# Patient Record
Sex: Male | Born: 1960 | Race: White | Hispanic: No | Marital: Single | Smoking: Former smoker
Health system: Southern US, Community
[De-identification: ages and names within clinical notes are randomized; demographics above are authoritative.]

## PROBLEM LIST (undated history)

## (undated) DIAGNOSIS — I82409 Acute embolism and thrombosis of unspecified deep veins of unspecified lower extremity: Secondary | ICD-10-CM

## (undated) DIAGNOSIS — E119 Type 2 diabetes mellitus without complications: Secondary | ICD-10-CM

## (undated) DIAGNOSIS — Z973 Presence of spectacles and contact lenses: Secondary | ICD-10-CM

## (undated) DIAGNOSIS — M940 Chondrocostal junction syndrome [Tietze]: Secondary | ICD-10-CM

## (undated) HISTORY — PX: TONSILECTOMY, ADENOIDECTOMY, BILATERAL MYRINGOTOMY AND TUBES: SHX2538

## (undated) HISTORY — PX: KNEE SURGERY: SHX244

## (undated) HISTORY — PX: COLONOSCOPY: SHX174

## (undated) HISTORY — PX: NASAL SEPTUM SURGERY: SHX37

## (undated) HISTORY — PX: BACK SURGERY: SHX140

## (undated) HISTORY — PX: INNER EAR SURGERY: SHX679

## (undated) HISTORY — PX: FOOT SURGERY: SHX648

---

## 2011-05-14 ENCOUNTER — Emergency Department: Payer: Self-pay | Admitting: *Deleted

## 2011-09-25 ENCOUNTER — Emergency Department (HOSPITAL_COMMUNITY)
Admission: EM | Admit: 2011-09-25 | Discharge: 2011-09-25 | Disposition: A | Discharge status: Home Health | Payer: Self-pay | Attending: Emergency Medicine | Admitting: Emergency Medicine

## 2011-09-25 ENCOUNTER — Emergency Department (HOSPITAL_COMMUNITY): Payer: Self-pay

## 2011-09-25 DIAGNOSIS — R209 Unspecified disturbances of skin sensation: Secondary | ICD-10-CM | POA: Insufficient documentation

## 2011-09-25 DIAGNOSIS — W312XXA Contact with powered woodworking and forming machines, initial encounter: Secondary | ICD-10-CM | POA: Insufficient documentation

## 2011-09-25 DIAGNOSIS — S61209A Unspecified open wound of unspecified finger without damage to nail, initial encounter: Secondary | ICD-10-CM | POA: Insufficient documentation

## 2011-09-25 DIAGNOSIS — S61219A Laceration without foreign body of unspecified finger without damage to nail, initial encounter: Secondary | ICD-10-CM

## 2011-09-25 MED ORDER — TETANUS-DIPHTHERIA TOXOIDS TD 5-2 LFU IM INJ
0.5000 mL | INJECTION | Freq: Once | INTRAMUSCULAR | Status: DC
  Filled 2011-09-25: qty 0.5

## 2011-09-25 MED ORDER — CEPHALEXIN 500 MG PO CAPS: 500.0000 mg | ORAL_CAPSULE | Freq: Two times a day (BID) | ORAL | Status: AC

## 2011-09-25 MED ORDER — CEPHALEXIN 250 MG PO CAPS
500.0000 mg | ORAL_CAPSULE | Freq: Once | ORAL | Status: AC
  Administered 2011-09-25: 500 mg via ORAL
  Filled 2011-09-25: qty 2

## 2011-09-25 MED ORDER — LIDOCAINE HCL (PF) 1 % IJ SOLN: 5.0000 mL | Freq: Once | INTRAMUSCULAR | Status: DC

## 2011-09-25 MED ORDER — TETANUS-DIPHTH-ACELL PERTUSSIS 5-2.5-18.5 LF-MCG/0.5 IM SUSP
0.5000 mL | Freq: Once | INTRAMUSCULAR | Status: AC
  Administered 2011-09-25: 0.5 mL via INTRAMUSCULAR

## 2011-09-25 NOTE — ED Notes (Signed)
Pt lacs are dressed. Pt states feeling starting to improve.

## 2011-09-25 NOTE — ED Provider Notes (Signed)
History     CSN: [id number]  Arrival date & time 09/25/11  0055   First MD Initiated Contact with Patient 09/25/11 [phone]      No chief complaint on file.   (Consider location/radiation/quality/duration/timing/severity/associated sxs/prior treatment) HPI Patient presents immediately after suffering laceration to his second and third digit on the left hand.  He is right-handed.  He had his left hand caught in the blade of a table saw.  Since the event he has had persistent, sharp pain in the effected fingers.  He notes mild tingling distally.  No other injuries, no other focal complaints. No past medical history on file.  No past surgical history on file.  No family history on file.  History  Substance Use Topics  . Smoking status: Not on file  . Smokeless tobacco: Not on file  . Alcohol Use: Not on file      Review of Systems  All other systems reviewed and are negative.    Allergies  Review of patient's allergies indicates no known allergies.  Home Medications  No current outpatient prescriptions on file.  There were no vitals taken for this visit.  Physical Exam  Nursing note and vitals reviewed. Constitutional: He appears well-developed and well-nourished. No distress.  HENT:  Head: Normocephalic and atraumatic.  Eyes: Conjunctivae and EOM are normal.  Cardiovascular: Normal rate, regular rhythm and intact distal pulses.   Pulmonary/Chest: Effort normal.  Musculoskeletal:       Arms: Skin: Skin is warm and dry.    ED Course  LACERATION REPAIR Date/Time: 09/25/2011 4:11 AM Performed by: Gerhard Munch Authorized by: Gerhard Munch Consent: Verbal consent obtained. The procedure was performed in an emergent situation. Risks and benefits: risks, benefits and alternatives were discussed Consent given by: patient Patient identity confirmed: verbally with patient Time out: Immediately prior to procedure a "time out" was called to verify the correct  patient, procedure, equipment, support staff and site/side marked as required. Body area: upper extremity (Left middle and pointer finger) Laceration length: 7 cm Contamination: The wound is contaminated. Tendon involvement: none Nerve involvement: superficial Vascular damage: no Anesthesia: digital block Local anesthetic: lidocaine 2% without epinephrine Anesthetic total: 8 ml Patient sedated: no Preparation: Patient was prepped and draped in the usual sterile fashion. Irrigation solution: saline Irrigation method: syringe Amount of cleaning: standard Debridement: none Degree of undermining: none Skin closure: 4-0 nylon Technique: simple Approximation: loose Approximation difficulty: simple Dressing: antibiotic ointment and gauze roll Patient tolerance: Patient tolerated the procedure well with no immediate complications.   (including critical care time)  Labs Reviewed - No data to display Dg Hand Complete Left  09/25/2011  *RADIOLOGY REPORT*  Clinical Data: Open fracture.  LEFT HAND - COMPLETE 3+ VIEW  Comparison: None.  Findings: Tiny 1 mm bony density is seen adjacent to the head of the middle phalanx of the long finger.  A avulsion fracture of indeterminate age is not excluded.  Bony framework is otherwise intact.  Soft tissue injury to the tuft of the long finger is noted.  IMPRESSION: Tiny avulsion fracture in the long finger as described of indeterminate age.  Original Report Authenticated By: Donavan Burnet, M.D.   XR reviewed by me  No diagnosis found.    MDM  This generally well they'll presents after suffering a laceration to 2 digits on his left hand in a table saw accident.  On exam the patient is in no distress.  There is sensory loss distal to the lacerations on  the ipsilateral side.  Refill is appropriate.  The patient has preserved flexion and extension in both digits.  There is evidence of a minor fracture, though it is unclear if this occurred during the  event.  The patient's wound repair, as above.  His tetanus status was updated.  Discharged with antibiotics to followup here for suture removal.     Gerhard Munch, MD 09/25/11 [phone]

## 2011-09-25 NOTE — ED Notes (Signed)
Pt to ED c/o lac to middle and ring finger of L hand after cutting it with a table saw.  Lacs into nail bed.  No active bleeding .  Pt does not want pain meds at this time.  Thru and thru lac but no avulsion.

## 2013-06-14 ENCOUNTER — Emergency Department (HOSPITAL_COMMUNITY)
Admission: EM | Admit: 2013-06-14 | Discharge: 2013-06-14 | Disposition: A | Discharge status: Home / Self Care | Payer: BC Managed Care – PPO | Source: Home / Self Care | Attending: Family Medicine | Admitting: Family Medicine

## 2013-06-14 ENCOUNTER — Encounter (HOSPITAL_COMMUNITY): Payer: Self-pay | Admitting: Emergency Medicine

## 2013-06-14 DIAGNOSIS — R071 Chest pain on breathing: Secondary | ICD-10-CM

## 2013-06-14 DIAGNOSIS — R0789 Other chest pain: Secondary | ICD-10-CM

## 2013-06-14 HISTORY — DX: Chondrocostal junction syndrome (tietze): M94.0

## 2013-06-14 MED ORDER — DICLOFENAC POTASSIUM 50 MG PO TABS: 50.0000 mg | ORAL_TABLET | Freq: Three times a day (TID) | ORAL | Status: DC

## 2013-06-14 NOTE — ED Provider Notes (Signed)
CSN: [id number]     Arrival date & time 06/14/13  1700 History   First MD Initiated Contact with Patient 06/14/13 1833     Chief Complaint  Patient presents with  . Chest Pain  . Abdominal Pain   (Consider location/radiation/quality/duration/timing/severity/associated sxs/prior Treatment) Patient is a 52 y.o. male presenting with chest pain and abdominal pain. The history is provided by the patient.  Chest Pain Pain location:  R lateral chest, L lateral chest and epigastric Pain quality: burning and sharp   Pain radiates to:  Does not radiate Pain radiates to the back: no   Pain severity:  Moderate Onset quality:  Gradual Duration:  4 days Progression:  Unchanged Chronicity:  Recurrent (h/o costochondrosis.) Context: movement and raising an arm   Associated symptoms: abdominal pain and cough   Associated symptoms: no fever, no heartburn, no palpitations and no shortness of breath   Risk factors: smoking   Abdominal Pain Associated symptoms include chest pain and abdominal pain. Pertinent negatives include no shortness of breath.    Past Medical History  Diagnosis Date  . Costochondritis    History reviewed. No pertinent past surgical history. No family history on file. History  Substance Use Topics  . Smoking status: Current Every Day Smoker  . Smokeless tobacco: Not on file  . Alcohol Use: Yes    Review of Systems  Constitutional: Negative.  Negative for fever.  Respiratory: Positive for cough. Negative for chest tightness and shortness of breath.   Cardiovascular: Positive for chest pain. Negative for palpitations.  Gastrointestinal: Positive for abdominal pain. Negative for heartburn.    Allergies  Review of patient's allergies indicates no known allergies.  Home Medications   Current Outpatient Rx  Name  Route  Sig  Dispense  Refill  . diclofenac (CATAFLAM) 50 MG tablet   Oral   Take 1 tablet (50 mg total) by mouth 3 (three) times daily. For chest soreness  as needed.   30 tablet   0    BP 135/72  Pulse 104  Temp(Src) 98.9 F (37.2 C) (Oral)  Resp 19  SpO2 96% Physical Exam  Nursing note and vitals reviewed. Constitutional: He is oriented to person, place, and time. He appears well-developed and well-nourished.  Neck: No thyromegaly present.  Cardiovascular: Normal rate, regular rhythm, normal heart sounds and intact distal pulses.   Pulmonary/Chest: Effort normal and breath sounds normal. He exhibits tenderness.    Abdominal: Soft. Bowel sounds are normal.  Lymphadenopathy:    He has no cervical adenopathy.  Neurological: He is alert and oriented to person, place, and time.  Skin: Skin is warm and dry.    ED Course  Procedures (including critical care time) Labs Review Labs Reviewed - No data to display Imaging Review No results found.  EKG Interpretation    Date/Time:    Ventricular Rate:    PR Interval:    QRS Duration:   QT Interval:    QTC Calculation:   R Axis:     Text Interpretation:              MDM  [id number]--wnl.    Linna Hoff, MD 06/14/13 [phone]

## 2013-06-14 NOTE — ED Notes (Signed)
Patient reports a burning sensation across chest and down both sides of abdomen, onset 11/27.  Patient denies nausea or vomiting.  Patient reports pain escalates with cough, sneeze, belching, hiccups.  Denies recent cold symptoms

## 2013-09-13 ENCOUNTER — Encounter (HOSPITAL_COMMUNITY): Payer: Self-pay | Admitting: Emergency Medicine

## 2013-09-13 ENCOUNTER — Emergency Department (HOSPITAL_COMMUNITY)
Admission: EM | Admit: 2013-09-13 | Discharge: 2013-09-13 | Disposition: A | Discharge status: Nursing Home (Includes ICF) | Payer: BC Managed Care – PPO | Source: Home / Self Care | Attending: Family Medicine | Admitting: Family Medicine

## 2013-09-13 ENCOUNTER — Emergency Department (HOSPITAL_COMMUNITY)
Admission: EM | Admit: 2013-09-13 | Discharge: 2013-09-13 | Disposition: A | Discharge status: Home / Self Care | Payer: BC Managed Care – PPO | Attending: Emergency Medicine | Admitting: Emergency Medicine

## 2013-09-13 DIAGNOSIS — M79606 Pain in leg, unspecified: Secondary | ICD-10-CM

## 2013-09-13 DIAGNOSIS — F17200 Nicotine dependence, unspecified, uncomplicated: Secondary | ICD-10-CM | POA: Insufficient documentation

## 2013-09-13 DIAGNOSIS — M7989 Other specified soft tissue disorders: Secondary | ICD-10-CM

## 2013-09-13 DIAGNOSIS — Z8739 Personal history of other diseases of the musculoskeletal system and connective tissue: Secondary | ICD-10-CM | POA: Insufficient documentation

## 2013-09-13 DIAGNOSIS — Z791 Long term (current) use of non-steroidal anti-inflammatories (NSAID): Secondary | ICD-10-CM | POA: Insufficient documentation

## 2013-09-13 DIAGNOSIS — I82402 Acute embolism and thrombosis of unspecified deep veins of left lower extremity: Secondary | ICD-10-CM

## 2013-09-13 DIAGNOSIS — M79609 Pain in unspecified limb: Secondary | ICD-10-CM

## 2013-09-13 DIAGNOSIS — I82409 Acute embolism and thrombosis of unspecified deep veins of unspecified lower extremity: Secondary | ICD-10-CM | POA: Insufficient documentation

## 2013-09-13 LAB — I-STAT CHEM 8, ED
BUN: 7 mg/dL (ref 6–23)
CALCIUM ION: 1.19 mmol/L (ref 1.12–1.23)
CHLORIDE: 99 meq/L (ref 96–112)
CREATININE: 0.8 mg/dL (ref 0.50–1.35)
GLUCOSE: 109 mg/dL — AB (ref 70–99)
HEMATOCRIT: 58 % — AB (ref 39.0–52.0)
HEMOGLOBIN: 19.7 g/dL — AB (ref 13.0–17.0)
Potassium: 4 mEq/L (ref 3.7–5.3)
Sodium: 142 mEq/L (ref 137–147)
TCO2: 28 mmol/L (ref 0–100)

## 2013-09-13 MED ORDER — RIVAROXABAN (XARELTO) VTE STARTER PACK (15 & 20 MG): ORAL_TABLET | ORAL | Status: DC

## 2013-09-13 NOTE — ED Notes (Signed)
Pt sent from urgent care with left calf redness, warmth, and swelling. Sent to R/O blood clot, urgent care already entered order.

## 2013-09-13 NOTE — ED Provider Notes (Signed)
CSN: [id number]     Arrival date & time 09/13/13  1704 History   First MD Initiated Contact with Patient 09/13/13 2146     Chief Complaint  Patient presents with  . Leg Pain     (Consider location/radiation/quality/duration/timing/severity/associated sxs/prior Treatment) [name] presenting with leg pain.  Leg Pain Location:  Leg Injury: no   Leg location:  L leg Pain details:    Quality:  Aching   Radiates to:  Does not radiate   Severity:  Mild   Onset quality:  Gradual   Duration:  3 days   Timing:  Constant   Progression:  Worsening Chronicity:  New Relieved by:  Nothing Exacerbated by: Palpation. Associated symptoms: swelling   Associated symptoms comment:  No chest pain or shortness of breath.   Past Medical History  Diagnosis Date  . Costochondritis    Past Surgical History  Procedure Laterality Date  . Joint replacement      knee surgery  . Back surgery     No family history on file. History  Substance Use Topics  . Smoking status: Current Every Day Smoker  . Smokeless tobacco: Not on file  . Alcohol Use: Yes    Review of Systems  All other systems reviewed and are negative.      Allergies  Thimerosal  Home Medications   Current Outpatient Rx  Name  Route  Sig  Dispense  Refill  . diclofenac (CATAFLAM) 50 MG tablet   Oral   Take 50 mg by mouth 3 (three) times daily.          BP 132/79  Pulse 92  Temp(Src) 99.2 F (37.3 C) (Oral)  Resp 18  Ht 6' (1.829 m)  Wt 225 lb (102.059 kg)  BMI 30.51 kg/m2  SpO2 96% Physical Exam  Nursing note and vitals reviewed. Constitutional: He is oriented to person, place, and time. He appears well-developed and well-nourished. No distress.  HENT:  Head: Normocephalic and atraumatic.  Mouth/Throat: Oropharynx is clear and moist.  Eyes: Conjunctivae are normal. Pupils are equal, round, and reactive to light. No scleral icterus.  Neck: Neck supple.  Cardiovascular: Normal rate,  regular rhythm, normal heart sounds and intact distal pulses.   No murmur heard. Pulmonary/Chest: Effort normal and breath sounds normal. No stridor. No respiratory distress. He has no wheezes. He has no rales.  Abdominal: Soft. He exhibits no distension. There is no tenderness.  Musculoskeletal: Normal range of motion. He exhibits edema (left lower extremity).  Neurological: He is alert and oriented to person, place, and time.  Skin: Skin is warm and dry. No rash noted.  Psychiatric: He has a normal mood and affect. His behavior is normal.    ED Course  Procedures (including critical care time) Labs Review Labs Reviewed  I-STAT CHEM 8, ED - Abnormal; Notable for the following:    Glucose, Bld 109 (*)    Hemoglobin 19.7 (*)    HCT 58.0 (*)    All other components within normal limits   Imaging Review No results found.   EKG Interpretation None      MDM   Final diagnoses:  Left leg DVT    53 year old male sent from urgent care for evaluation of a swollen left leg. Found to have an acute DVT on ultrasound. He appears to be low risk for bleeding and will need to be started on anticoagulation.  No signs or symptoms of PE. Will initiate Xarelto indicates resources  to establish PCP care.    Candyce ChurnJohn David Sophonie Goforth III, MD 09/13/13 [phone]

## 2013-09-13 NOTE — ED Provider Notes (Signed)
CSN: [id number]     Arrival date & time 09/13/13  1427 History   None    Chief Complaint  Patient presents with  . Leg Pain   (Consider location/radiation/quality/duration/timing/severity/associated sxs/prior Treatment) HPI Comments: 53 year old male presents for evaluation of left leg pain and swelling. This is been present for 3 days, getting progressively worse. He denies any injury and does not know what he could come causes. He has a recent increased level of physical activity. He does admit to recent prolonged immobility with a viral URI, being in bed for about 5 days. He denies any chest pain or shortness of breath at this time. He says the left leg is swollen and tender in the calf. He has been elevating it and applying heat.  No history of DVT or PE. No recent travel  [name] presenting with leg pain.  Leg Pain Associated symptoms: no fatigue, no fever and no neck pain     Past Medical History  Diagnosis Date  . Costochondritis    History reviewed. No pertinent past surgical history. History reviewed. No pertinent family history. History  Substance Use Topics  . Smoking status: Current Every Day Smoker  . Smokeless tobacco: Not on file  . Alcohol Use: Yes    Review of Systems  Constitutional: Negative for fever, chills and fatigue.  HENT: Negative for sore throat.   Eyes: Negative for visual disturbance.  Respiratory: Negative for cough and shortness of breath.   Cardiovascular: Positive for leg swelling (and pain in the left). Negative for chest pain and palpitations.  Gastrointestinal: Negative for nausea, vomiting, abdominal pain, diarrhea and constipation.  Genitourinary: Negative for dysuria, urgency, frequency and hematuria.  Musculoskeletal: Negative for arthralgias, myalgias, neck pain and neck stiffness.  Skin: Negative for rash.  Neurological: Negative for dizziness, weakness and light-headedness.    Allergies  Review of patient's  allergies indicates no known allergies.  Home Medications   Current Outpatient Rx  Name  Route  Sig  Dispense  Refill  . diclofenac (CATAFLAM) 50 MG tablet   Oral   Take 1 tablet (50 mg total) by mouth 3 (three) times daily. For chest soreness as needed.   30 tablet   0    BP 152/91  Pulse 90  Temp(Src) 99.4 F (37.4 C) (Oral)  Resp 18  SpO2 95% Physical Exam  Nursing note and vitals reviewed. Constitutional: He is oriented to person, place, and time. He appears well-developed and well-nourished. No distress.  HENT:  Head: Normocephalic.  Cardiovascular: Normal rate, regular rhythm and normal heart sounds.   Pulmonary/Chest: Effort normal and breath sounds normal. No respiratory distress.  Musculoskeletal:  Left calf: Swelling, with greater the 1 cm difference between the left and right. Positive Homans sign. Tender, palpable cord in the calf. No edema. Compared to the right, the left lower extremity is very warm. The pain does not go up into the thigh  Neurological: He is alert and oriented to person, place, and time. Coordination normal.  Skin: Skin is warm and dry. No rash noted. He is not diaphoretic.  Psychiatric: He has a normal mood and affect. Judgment normal.    ED Course  Procedures (including critical care time) Labs Review Labs Reviewed - No data to display Imaging Review No results found.   MDM   1. Leg pain   2. Leg swelling    Patient with a warm, swollen, left lower extremity. Positive Homans sign. Low-grade fever. Probable DVT,  patient has been sent over to get a venous duplex and will follow up in the ER for initiation of treatment and coordination of care      Scott GoodZachary H Bijal Siglin, PA-C 09/13/13 1638

## 2013-09-13 NOTE — ED Notes (Signed)
Pulse palpable to LLE

## 2013-09-13 NOTE — ED Notes (Signed)
Pt  Reports  Pain  And  Swelling   Of  The  Left  Calf  X     3  Days        denys  Any      Injury           denys  Any  Risk  Factors           Pt  denys  Any  Chest  Pain  Or    Shortness  Of  Breath      Skin is  Warm  /  Dry

## 2013-09-13 NOTE — ED Notes (Signed)
Spoke  To  Lorrine Kinindy  Crawford  In  X ray  And  Notified   Her  Of  Pt  Being transferred  Toe er    And  gneed  gor  Doppler  study

## 2013-09-13 NOTE — Progress Notes (Signed)
*  PRELIMINARY RESULTS* Vascular Ultrasound Left lower extremity venous duplex has been completed.  Preliminary findings: Evidence of DVT involving the Left distal Femoral vein, popliteal vein, posterior tibial veins, and peroneal veins. No DVT of the Right CFV.   Farrel DemarkJill Eunice, RDMS, RVT  09/13/2013, 5:59 PM

## 2013-09-13 NOTE — Discharge Instructions (Signed)
Deep Vein Thrombosis A deep vein thrombosis (DVT) is a blood clot that develops in the deep, larger veins of the leg, arm, or pelvis. These are more dangerous than clots that might form in veins near the surface of the body. A DVT can lead to complications if the clot breaks off and travels in the bloodstream to the lungs.  A DVT can damage the valves in your leg veins, so that instead of flowing upward, the blood pools in the lower leg. This is called post-thrombotic syndrome, and it can result in pain, swelling, discoloration, and sores on the leg. CAUSES Usually, several things contribute to blood clots forming. Contributing factors include:  The flow of blood slows down.  The inside of the vein is damaged in some way.  You have a condition that makes blood clot more easily. RISK FACTORS Some people are more likely than others to develop blood clots. Risk factors include:   Older age, especially over 53 years of age.  Having a family history of blood clots or if you have already had a blot clot.  Having major or lengthy surgery. This is especially true for surgery on the hip, knee, or belly (abdomen). Hip surgery is particularly high risk.  Breaking a hip or leg.  Sitting or lying still for a long time. This includes long-distance travel, paralysis, or recovery from an illness or surgery.  Having cancer or cancer treatment.  Having a long, thin tube (catheter) placed inside a vein during a medical procedure.  Being overweight (obese).  Pregnancy and childbirth.  Hormone changes make the blood clot more easily during pregnancy.  The fetus puts pressure on the veins of the pelvis.  There is a risk of injury to veins during delivery or a caesarean. The risk is highest just after childbirth.  Medicines with the male hormone estrogen. This includes birth control pills and hormone replacement therapy.  Smoking.  Other circulation or heart problems.  SIGNS AND SYMPTOMS When  a clot forms, it can either partially or totally block the blood flow in that vein. Symptoms of a DVT can include:  Swelling of the leg or arm, especially if one side is much worse.  Warmth and redness of the leg or arm, especially if one side is much worse.  Pain in an arm or leg. If the clot is in the leg, symptoms may be more noticeable or worse when standing or walking. The symptoms of a DVT that has traveled to the lungs (pulmonary embolism, PE) usually start suddenly and include:  Shortness of breath.  Coughing.  Coughing up blood or blood-tinged phlegm.  Chest pain. The chest pain is often worse with deep breaths.  Rapid heartbeat. Anyone with these symptoms should get emergency medical treatment right away. Call your local emergency services (911 in the U.S.) if you have these symptoms. DIAGNOSIS If a DVT is suspected, your health care provider will take a full medical history and perform a physical exam. Tests that also may be required include:  Blood tests, including studies of the clotting properties of the blood.  Ultrasonography to see if you have clots in your legs or lungs.  X-rays to show the flow of blood when dye is injected into the veins (venography).  Studies of your lungs if you have any chest symptoms. PREVENTION  Exercise the legs regularly. Take a brisk 30-minute walk every day.  Maintain a weight that is appropriate for your height.  Avoid sitting or lying in bed  for long periods of time without moving your legs.  Women, particularly those over the age of 24 years, should consider the risks and benefits of taking estrogen medicines, including birth control pills.  Do not smoke, especially if you take estrogen medicines.  Long-distance travel can increase your risk of DVT. You should exercise your legs by walking or pumping the muscles every hour.  In-hospital prevention:  Many of the risk factors above relate to situations that exist with  hospitalization, either for illness, injury, or elective surgery.  Your health care provider will assess you for the need for venous thromboembolism prophylaxis when you are admitted to the hospital. If you are having surgery, your surgeon will assess you the day of or day after surgery.  Prevention may include medical and nonmedical measures. TREATMENT Once identified, a DVT can be treated. It can also be prevented in some circumstances. Once you have had a DVT, you may be at increased risk for a DVT in the future. The most common treatment for DVT is blood thinning (anticoagulant) medicine, which reduces the blood's tendency to clot. Anticoagulants can stop new blood clots from forming and stop old ones from growing. They cannot dissolve existing clots. Your body does this by itself over time. Anticoagulants can be given by mouth, by IV access, or by injection. Your health care provider will determine the best program for you. Other medicines or treatments that may be used are:  Heparin or related medicines (low molecular weight heparin) are usually the first treatment for a blood clot. They act quickly. However, they cannot be taken orally.  Heparin can cause a fall in a component of blood that stops bleeding and forms blood clots (platelets). You will be monitored with blood tests to be sure this does not occur.  Warfarin is an anticoagulant that can be swallowed. It takes a few days to start working, so usually heparin or related medicines are used in combination. Once warfarin is working, heparin is usually stopped.  Less commonly, clot dissolving drugs (thrombolytics) are used to dissolve a DVT. They carry a high risk of bleeding, so they are used mainly in severe cases, where your life or a limb is threatened.  Very rarely, a blood clot in the leg needs to be removed surgically.  If you are unable to take anticoagulants, your health care provider may arrange for you to have a filter placed  in a main vein in your abdomen. This filter prevents clots from traveling to your lungs. HOME CARE INSTRUCTIONS  Take all medicines prescribed by your health care provider. Only take over-the-counter or prescription medicines for pain, fever, or discomfort as directed by your health care provider.  Warfarin. Most people will continue taking warfarin after hospital discharge. Your health care provider will advise you on the length of treatment (usually 3 6 months, sometimes lifelong).  Too much and too little warfarin are both dangerous. Too much warfarin increases the risk of bleeding. Too little warfarin continues to allow the risk for blood clots. While taking warfarin, you will need to have regular blood tests to measure your blood clotting time. These blood tests usually include both the prothrombin time (PT) and international normalized ratio (INR) tests. The PT and INR results allow your health care provider to adjust your dose of warfarin. The dose can change for many reasons. It is critically important that you take warfarin exactly as prescribed, and that you have your PT and INR levels drawn exactly as directed.  Many foods, especially foods high in vitamin K, can interfere with warfarin and affect the PT and INR results. Foods high in vitamin K include spinach, kale, broccoli, cabbage, collard and turnip greens, brussel sprouts, peas, cauliflower, seaweed, and parsley as well as beef and pork liver, green tea, and soybean oil. You should eat a consistent amount of foods high in vitamin K. Avoid major changes in your diet, or notify your health care provider before changing your diet. Arrange a visit with a dietitian to answer your questions.  Many medicines can interfere with warfarin and affect the PT and INR results. You must tell your health care provider about any and all medicines you take. This includes all vitamins and supplements. Be especially cautious with aspirin and  anti-inflammatory medicines. Ask your health care provider before taking these. Do not take or discontinue any prescribed or over-the-counter medicine except on the advice of your health care provider or pharmacist.  Warfarin can have side effects, primarily excessive bruising or bleeding. You will need to hold pressure over cuts for longer than usual. Your health care provider or pharmacist will discuss other potential side effects.  Alcohol can change the body's ability to handle warfarin. It is best to avoid alcoholic drinks or consume only very small amounts while taking warfarin. Notify your health care provider if you change your alcohol intake.  Notify your dentist or other health care providers before procedures.  Activity. Ask your health care provider how soon you can go back to normal activities. It is important to stay active to prevent blood clots. If you are on anticoagulant medicine, avoid contact sports.  Exercise. It is very important to exercise. This is especially important while traveling, sitting, or standing for long periods of time. Exercise your legs by walking or by pumping the muscles frequently. Take frequent walks.  Compression stockings. These are tight elastic stockings that apply pressure to the lower legs. This pressure can help keep the blood in the legs from clotting. You may need to wear compression stockings at home to help prevent a DVT.  Do not smoke. If you smoke, quit. Ask your health care provider for help with quitting smoking.  Learn as much as you can about DVT. Knowing more about the condition should help you keep it from coming back.  Wear a medical alert bracelet or carry a medical alert card. SEEK MEDICAL CARE IF:  You notice a rapid heartbeat.  You feel weaker or more tired than usual.  You feel faint.  You notice increased bruising.  You feel your symptoms are not getting better in the time expected.  You believe you are having side  effects of medicine. SEEK IMMEDIATE MEDICAL CARE IF:  You have chest pain.  You have trouble breathing.  You have new or increased swelling or pain in one leg.  You cough up blood.  You notice blood in vomit, in a bowel movement, or in urine. MAKE SURE YOU:  Understand these instructions.  Will watch your condition.  Will get help right away if you are not doing well or get worse. Document Released: 07/01/2005 Document Revised: 04/21/2013 Document Reviewed: 03/08/2013 [facility] Patient Information 2014 [place], [place].   Emergency Department Resource Guide 1) Find a Doctor and Pay Out of Pocket Although you won't have to find out who is covered by your insurance plan, it is a good idea to ask around and get recommendations. You will then need to call the office and see if the  doctor you have chosen will accept you as a new patient and what types of options they offer for patients who are self-pay. Some doctors offer discounts or will set up payment plans for their patients who do not have insurance, but you will need to ask so you aren't surprised when you get to your appointment.  2) Contact Your Local Health Department Not all health departments have doctors that can see patients for sick visits, but many do, so it is worth a call to see if yours does. If you don't know where your local health department is, you can check in your phone book. The CDC also has a tool to help you locate your state's health department, and many state websites also have listings of all of their local health departments.  3) Find a Walk-in Clinic If your illness is not likely to be very severe or complicated, you may want to try a walk in clinic. These are popping up all over the country in pharmacies, drugstores, and shopping centers. They're usually staffed by nurse practitioners or physician assistants that have been trained to treat common illnesses and complaints. They're usually fairly quick and  inexpensive. However, if you have serious medical issues or chronic medical problems, these are probably not your best option.  No Primary Care Doctor: - Call Health Connect at  [phone] - they can help you locate a primary care doctor that  accepts your insurance, provides certain services, etc. - Physician Referral Service- [phone]  Chronic Pain Problems: Organization         Address  Phone   Notes  [facility]  [phone] Patients need to be referred by their primary care doctor.   Medication Assistance: Organization         Address  Phone   Notes  [facility] Medication [facility] [address] [place] [place], [place] [address] [phone] --Must be a resident of [facility] -- Must have NO insurance coverage whatsoever (no Medicaid/ Medicare, etc.) -- The pt. MUST have a primary care doctor that directs their care regularly and follows them in the community   [facility]  [phone]   [employer]  [phone]    Agencies that provide inexpensive medical care: Organization         Address  Phone   Notes  [facility]  [phone]   [facility]    [phone]   [facility] [address] [place], [place] [address] [phone]   [facility] [address] [place] [place] [phone]   Planned Parenthood    [phone]   [facility]    [phone]   Community Health and [facility]  [address], [place] Phone:  [phone], Fax:  [phone] Hours of Operation:  9 am - 6 pm, M-F.  Also accepts Medicaid/Medicare and self-pay.  [facility]  [address], [place] Phone: [phone], Fax: [phone]. Hours of Operation:  8:30 am - 5:30 pm, M-F.  Also accepts Medicaid and self-pay.  [facility] [place] [address], [place] Phone: [phone]   [facility] [address] Natasha Bence [place], [place] [phone], Ext. [phone] Mondays & Thursdays: 7-9 AM.  First 15 patients are seen on a first come, first serve basis.    Medicaid-accepting [facility] Providers:  Organization  Address  Phone   Notes  [facility] [address],  [phone] Also accepts self-pay patients.  [facility] [address] Laurell Josephs [place], [place]  [phone]   [facility] [address], [place] [phone]   [facility] [address], [place] [phone]   Renaye Rakers [address], [place]   [phone] Only accepts [place] patients after they have their name applied to their card.   Self-Pay (no insurance) in [facility]:  Organization         Address  Phone   Notes  Sickle Cell Patients, [facility] [address] [place], [place] [phone]   [facility] [address] [place], [place] [phone]   [facility] [place]  [address], [place] [phone]   [facility]/Dr. Osei-Bonsu  [address], [place] or [address], [place] [phone] Phone number for both [place] and [place] locations is the same.  Urgent Medical and [facility] [address] [place] [phone]   [facility] [address] [place] or [address] [phone]   [facility] [address], [place] [phone], phone; [phone], fax Sees patients 1st and 3rd Saturday of every month.  Must not qualify for public or private insurance (i.e. Medicaid, Medicare, [place] Health Choice, Veterans' Benefits)  Household income should be no more than 200% of the poverty level The clinic cannot treat you if you are pregnant or  think you are pregnant  Sexually transmitted diseases are not treated at the clinic.    Dental Care: Organization         Address  Phone  Notes  [facility] Department of [facility] [address] [place], [place] [phone] Accepts children up to age 18 who are enrolled in [place] or [place] Health Choice; pregnant women with a Medicaid card; and children who have applied for Medicaid or [place] Health Choice, but were declined, whose parents can pay a reduced fee at time of service.  [facility] Department of [facility]  [address], [place] [phone] Accepts children up to age 22 who are enrolled in [place] or [place] Health Choice; pregnant women with a Medicaid card; and children who have applied for Medicaid or [place] Health Choice, but were declined, whose parents can pay a reduced fee at time of service.  [facility]  [address] [place], [place] [phone] Patients are seen by appointment only. Walk-ins are not accepted. [facility] will see patients 82 years of age and older. Monday - Tuesday (8am-5pm) Most Wednesdays (8:30-5pm) $30 per visit, cash only  [facility] Adult [facility] Access PRO[facility]  [address], [place] [facility] [phone] Patients are seen by appointment only. Walk-ins are not accepted. [facility] will see patients 35 years of age and older. One Wednesday Evening (Monthly: Volunteer Based).  $30 per visit, cash only  [employer]  [phone] for adults; Children under age 69, call Graduate Pediatric Dentistry at [phone]. Children aged 32-14, please call [phone] to request a pediatric application.  Dental services are provided in all areas of dental care including fillings,  crowns and bridges, complete and partial dentures, implants, gum treatment, root canals, and extractions. Preventive care is also provided. Treatment is provided to both adults  and children. Patients are selected via a lottery and there is often a waiting list.   [facility] [address], [place]  [phone] [email or web address]   [facility] [address], [place], [place] [phone], Ext. [phone] Second and Fourth Thursday of each month, opens at 6:30 AM; Clinic ends at 9 AM.  Patients are seen on a first-come first-served basis, and a limited number are seen during each clinic.   [facility]  [address], [place], [place] [phone]   Eligibility Requirements You must have lived in [place], [place], or [place] counties for at least the last three months.   You cannot be eligible for state or federal sponsored [place]healthcare insurance, including CIG[employer]eterans Administration, [place]icaid, or [employer]icare.   You generally cannot be eligible for healthcare insurance through your employer.    How to apply: Eligibility screenings are held every Tuesday and Wednesday afternoon from 1:00 pm until 4:00 pm. You do not need an appointment for the interview!  [facility] [address], [place], [place] [phone]   [facility]  [phone]   [facility]  [phone]   [facility]  [phone]    Behavioral Health Resources in the Community: Intensive Outpatient Programs Organization         Address  Phone  Notes  [facility] [address], [place], [place] [phone]   [facility] [address], [place], [place] [phone]   [facility] [address], [place], [place]  [phone]   [facility] [address],  [place], [place] [phone] or [phone]   Substance Abuse Resources Organization         Address  Phone  Notes  [facility] Services  [phone]   [facility]  [phone]   [facility]  [phone]     [facility]  [phone]   Residential & Outpatient Substance Abuse Program  [phone]   Psychological Services Organization         Address  Phone  Notes  [facility]  [phone]- [phone]   [facility]  [phone]- [phone]   [facility] [address], [place] [phone] or [phone]    Mobile Crisis Teams Organization         Address  Phone  Notes  [facility], Mobile Crisis Care Unit  [phone]   [facility]  [address] [place], [place] [phone]   Doristine LocksSharon DeEsch [address] [place] [phone]    Self-Help/Support Groups Organization         Address  Phone             Notes  [facility] [place] - variety of support groups  [phone] Call for more information  Narcotics Anonymous (NA), [facility]aring [facility] [address], [place]  2 meetings at this location   Statisticianesidential Treatment Programs Organization         Address  Phone  Notes  [facility] [address],    [place]  [phone]   [facility]  [address], [place] [phone], [place], [place] [phone]   [facility] [address],  [place] [phone] Admissions: 8am-3pm M-F  [facility] [address]    [place], [place] [phone]   [facility] [address] [place], [place], [place] [phone]   [facility] [address]  [place], [place] [phone]   [facility] - Intensive Outpatient [address] Laurell Josephs [address], [place], [place] [phone]   [facility] ([facility]iction [facility] [address] [place]  [place], [place] [phone] or [phone]   [facility] ([facility]) [address] [place], [place] [phone] Accepts Medicaid  [facility] [address],  [place] [phone] Substance Abuse/Addiction Treatment   [facility] Organization         Address  Phone  Notes  [facility]  [phone]   Angie Fava, PhD [address] Ervin Knack [place], [place]   [phone] or [phone]   [facility]al   [address] [place], [place] [phone]   [facility] [address], [place], [place] [phone] Insurance/Medicaid/sponsorship through [facility] [address]                                    [place], [place] [phone] Therapy/tele-psych/case  [facility] [address], [place] [phone]    Dr. Lolly Mustache  [phone]   [facility]  [employer] [facility] Dept. 1) [address] [place] 2) [address], [place] 3)  [address], [place] [phone]  [phone]   [place] [facility] Child Abuse Hotline [phone] or [phone] (After Hours)

## 2013-09-13 NOTE — ED Provider Notes (Signed)
Medical screening examination/treatment/procedure(s) were performed by resident physician or non-physician practitioner and as supervising physician I was immediately available for consultation/collaboration.   Barkley BrunsKINDL,Makayia Duplessis DOUGLAS MD.   Linna HoffJames D Elanor Cale, MD 09/13/13 [phone]

## 2013-09-13 NOTE — ED Notes (Signed)
Pt A&Ox4, ambulatory at discharge, verbalizing no complaints at this time. 

## 2013-09-14 NOTE — Progress Notes (Signed)
ED CM received call from [facility] in [facility] concerning medication assistance with Alen Blew. Contacted patient regarding assistance with 30 day free trial starter kit. Pt instructed to come back to [facility] ED to received the [facility] Medication Card from CM. Pt verbalized understanding and is agreeable. Pt states he should be here to meet with ED CM in 1 hour.

## 2013-09-22 ENCOUNTER — Other Ambulatory Visit: Payer: Self-pay | Admitting: Emergency Medicine

## 2013-09-22 ENCOUNTER — Ambulatory Visit: Payer: BC Managed Care – PPO | Attending: Cardiology | Admitting: Cardiology

## 2013-09-22 ENCOUNTER — Encounter: Payer: Self-pay | Admitting: Cardiology

## 2013-09-22 VITALS — BP 137/84 | HR 102 | Temp 98.4°F | Resp 16 | Ht 72.0 in | Wt 226.0 lb

## 2013-09-22 DIAGNOSIS — I824Y9 Acute embolism and thrombosis of unspecified deep veins of unspecified proximal lower extremity: Secondary | ICD-10-CM | POA: Insufficient documentation

## 2013-09-22 DIAGNOSIS — Z720 Tobacco use: Secondary | ICD-10-CM

## 2013-09-22 DIAGNOSIS — F17200 Nicotine dependence, unspecified, uncomplicated: Secondary | ICD-10-CM | POA: Insufficient documentation

## 2013-09-22 DIAGNOSIS — I82409 Acute embolism and thrombosis of unspecified deep veins of unspecified lower extremity: Secondary | ICD-10-CM

## 2013-09-22 LAB — POCT INR: INR: 1.3

## 2013-09-22 MED ORDER — WARFARIN SODIUM 5 MG PO TABS: 5.0000 mg | ORAL_TABLET | Freq: Every day | ORAL | Status: DC

## 2013-09-22 NOTE — Progress Notes (Signed)
Pt here to f/u with Dr. 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

## 2013-09-22 NOTE — Assessment & Plan Note (Signed)
He's been advised to quit.

## 2013-09-22 NOTE — Patient Instructions (Signed)
Pt instructed to start taking Coumadin 5 mg daily and return on Monday 09/27/13 We will recheck for therapeutic levels in not between 2-3,we increase Coumadin 7.5 mg

## 2013-09-22 NOTE — Assessment & Plan Note (Signed)
Begin Coumadin 5 mg a day. Return to the clinic for blood work in 2 days. Take off of overlap with Xarelto 24 hours after INR between 2 and 3. I've advised him to stop smoking and to not do any vigorous exercise for the next couple weeks. I've also advised him to take Coumadin for 3 months at which time will repeat a lower extremity ultrasound. With the extensiveness of his clot, he may need 6 months of Coumadin.

## 2013-09-22 NOTE — Progress Notes (Signed)
Mr. Scott Quinn comes here today to establish with us for primary care. He was just discharged emergency room with a left lower extremity DVT which extends to the femoral vein. He has not start Coumadin yet. He continues to smoke about 8-10 cigarettes a day. He does not exercise on regular basis. There no other obvious reasons for predisposition to DVT.  Exam is unchanged today for an emergency room.

## 2013-09-27 ENCOUNTER — Ambulatory Visit: Payer: BC Managed Care – PPO | Attending: Internal Medicine

## 2013-09-27 DIAGNOSIS — I82409 Acute embolism and thrombosis of unspecified deep veins of unspecified lower extremity: Secondary | ICD-10-CM

## 2013-09-27 NOTE — Patient Instructions (Signed)
Pt instructed to take 7.5 mg for two days and resume 5 mg daily until next week repeat

## 2013-10-04 ENCOUNTER — Ambulatory Visit: Payer: BC Managed Care – PPO | Attending: Internal Medicine

## 2013-10-04 ENCOUNTER — Other Ambulatory Visit: Payer: BC Managed Care – PPO

## 2013-10-04 DIAGNOSIS — I82409 Acute embolism and thrombosis of unspecified deep veins of unspecified lower extremity: Secondary | ICD-10-CM

## 2013-10-04 LAB — POCT INR: INR: 1.5

## 2013-10-04 NOTE — Progress Notes (Signed)
Pt instructed to take increase Coumadin to 7.5 mg for 2 dys then and take Xeralto 20 mg dose, and return to clinic for repeat 10/07/13 Pt denies bleeding

## 2013-10-04 NOTE — Patient Instructions (Addendum)
Pt instructed to take coumadin 7.5 mg for 2 dys and Xeralto 20 mg dose and return to clinic 10/07/13 Pt admits to taking incorrect dosing of Xeralto. Pt was taking 15 mg in the am and 20 mg in evening. Pt instructed to only take 20 mg tab with Coumadin 7.5 mg tab tonight

## 2013-10-05 ENCOUNTER — Telehealth: Payer: Self-pay | Admitting: Internal Medicine

## 2013-10-05 NOTE — Telephone Encounter (Signed)
Pt returning call for nurse, Noreene LarssonJill.

## 2013-10-06 NOTE — Telephone Encounter (Signed)
Spoke with pt . Venous doppler order, placed by PA for future. We will recheck in 3 mnths

## 2013-10-07 ENCOUNTER — Encounter: Payer: Self-pay | Admitting: Internal Medicine

## 2013-10-07 ENCOUNTER — Ambulatory Visit: Payer: BC Managed Care – PPO | Attending: Internal Medicine

## 2013-10-07 ENCOUNTER — Ambulatory Visit: Payer: BC Managed Care – PPO | Attending: Internal Medicine | Admitting: Internal Medicine

## 2013-10-07 VITALS — BP 121/83 | HR 78 | Temp 98.0°F | Resp 16 | Ht 72.0 in | Wt 225.0 lb

## 2013-10-07 DIAGNOSIS — Z79899 Other long term (current) drug therapy: Secondary | ICD-10-CM | POA: Insufficient documentation

## 2013-10-07 DIAGNOSIS — I82409 Acute embolism and thrombosis of unspecified deep veins of unspecified lower extremity: Secondary | ICD-10-CM

## 2013-10-07 DIAGNOSIS — I82509 Chronic embolism and thrombosis of unspecified deep veins of unspecified lower extremity: Secondary | ICD-10-CM | POA: Insufficient documentation

## 2013-10-07 DIAGNOSIS — Z7901 Long term (current) use of anticoagulants: Secondary | ICD-10-CM | POA: Insufficient documentation

## 2013-10-07 DIAGNOSIS — Z720 Tobacco use: Secondary | ICD-10-CM

## 2013-10-07 DIAGNOSIS — F17200 Nicotine dependence, unspecified, uncomplicated: Secondary | ICD-10-CM

## 2013-10-07 NOTE — Progress Notes (Signed)
Patient ID: Scott Quinn, male   DOB: 1960-10-03, 53 y.o.   MRN: [id number]   Scott Quinn, is a 53 y.o. male  [id number]  [id number]  DOB - 07-21-1960  Chief Complaint  Patient presents with  . Follow-up        Subjective:   Scott Quinn is a 53 y.o. male here today for a follow up visit. Patient was recently diagnosed with DVT of the lower limb was started on Xarelto, Dr. wall saw patient last week and started on Coumadin and bridging, to stop Xarelto when INR is therapeutic. INR today is 1.7. He has no other complaint, he has no significant past medical history except for heavy smoking. He claims he is trying to quit now he is dying to 5 cigarettes per day. He also drinks alcohol heavily.  Patient has No headache, No chest pain, No abdominal pain - No Nausea, No new weakness tingling or numbness, No Cough - SOB.  Problem  Dvt (Deep Venous Thrombosis)    ALLERGIES: Allergies  Allergen Reactions  . Thimerosal     PAST MEDICAL HISTORY: Past Medical History  Diagnosis Date  . Costochondritis     MEDICATIONS AT HOME: Prior to Admission medications   Medication Sig Start Date End Date Taking? Authorizing Provider  Rivaroxaban 15 & 20 MG TBPK Take as directed on package: Start with one 15mg  tablet by mouth twice a day with food. On Day 22, switch to one 20mg  tablet once a day with food. 09/13/13  Yes Candyce Churn III, MD  warfarin (COUMADIN) 5 MG tablet Take 1 tablet (5 mg total) by mouth daily. 09/22/13  Yes Gaylord Shih, MD     Objective:   Filed Vitals:   10/07/13 1047  BP: 121/83  Pulse: 78  Temp: 98 F (36.7 C)  TempSrc: Oral  Resp: 16  Height: 6' (1.829 m)  Weight: 225 lb (102.059 kg)  SpO2: 96%    Exam General appearance : Awake, alert, not in any distress. Speech Clear. Not toxic looking HEENT: Atraumatic and Normocephalic, pupils equally reactive to light and accomodation Neck: supple, no JVD. No cervical lymphadenopathy.    Chest:Good air entry bilaterally, no added sounds  CVS: S1 S2 regular, no murmurs.  Abdomen: Bowel sounds present, Non tender and not distended with no gaurding, rigidity or rebound. Extremities: B/L Lower Ext shows no edema, both legs are warm to touch Neurology: Awake alert, and oriented X 3, CN II-XII intact, Non focal Skin:No Rash Wounds:N/A  Data Review No results found for this basename: HGBA1C     Assessment & Plan   1. DVT (deep venous thrombosis) Continue Xarelto at the current dose Coumadin dose has been adjusted per protocol and we will see patient back in 4 days for INR  2. Tobacco use Patient was counseled extensively on smoking cessation especially in the setting of DVT  Patient was instructed not to engage in vigorous exercise that would dislodge any clot in the meantime until adequate anticoagulation is achieved   Return in about 4 days (around 10/11/2013) for INR Check.  The patient was given clear instructions to go to ER or return to medical center if symptoms don't improve, worsen or new problems develop. The patient verbalized understanding. The patient was told to call to get lab results if they haven't heard anything in the next week.   This note has been created with Education officer, environmental. Any transcriptional errors are unintentional.  Jeanann LewandowskyJEGEDE, Bexleigh Theriault, MD, MHA, FACP, FAAP [facility] [place], [place] [phone]   10/07/2013, 11:04 AM

## 2013-10-07 NOTE — Progress Notes (Signed)
Pt is here following up on his left leg DVT.

## 2013-10-07 NOTE — Patient Instructions (Signed)
Warfarin tablets °What is this medicine? °WARFARIN (WAR far in) is an anticoagulant. It is used to treat or prevent clots in the veins, arteries, lungs, or heart. °This medicine may be used for other purposes; ask your health care provider or pharmacist if you have questions. °COMMON BRAND NAME(S): Coumadin, Jantoven  °What should I tell my health care provider before I take this medicine? °They need to know if you have any of these conditions: °-alcoholism °-anemia °-bleeding disorders °-cancer °-diabetes °-heart disease °-high blood pressure °-history of bleeding in the gastrointestinal tract °-history of stroke or other brain injury or disease °-kidney or liver disease °-protein C deficiency °-protein S deficiency °-psychosis or dementia °-recent injury, recent or planned surgery or procedure °-an unusual or allergic reaction to warfarin, other medicines, foods, dyes, or preservatives °-pregnant or trying to get pregnant °-breast-feeding °How should I use this medicine? °Take this medicine by mouth with a glass of water. Follow the directions on the prescription label. You can take this medicine with or without food. Take your medicine at the same time each day. Do not take it more often than directed. Do not stop taking except on your doctor's advice. Stopping this medicine may increase your risk of a blood clot. Be sure to refill your prescription before you run out of medicine. °If your doctor or healthcare professional calls to change your dose, write down the dose and any other instructions. Always read the dose and instructions back to him or her to make sure you understand them. Tell your doctor or healthcare professional what strength of tablets you have on hand. Ask how many tablets you should take to equal your new dose. Write the date on the new instructions and keep them near your medicine. If you are told to stop taking your medicine until your next blood test, call your doctor or healthcare  professional if you do not hear anything within 24 hours of the test to find out your new dose or when to restart your prior dose. °A special MedGuide will be given to you by the pharmacist with each prescription and refill. Be sure to read this information carefully each time. °Talk to your pediatrician regarding the use of this medicine in children. Special care may be needed. °Overdosage: If you think you have taken too much of this medicine contact a poison control center or emergency room at once. °NOTE: This medicine is only for you. Do not share this medicine with others. °What if I miss a dose? °It is important not to miss a dose. If you miss a dose, call your healthcare provider. Take the dose as soon as possible on the same day. If it is almost time for your next dose, take only that dose. Do not take double or extra doses to make up for a missed dose. °What may interact with this medicine? °Do not take this medicine with any of the following medications: °-agents that prevent or dissolve blood clots °-aspirin or other salicylates °-danshen °-dextrothyroxine °-mifepristone °-St. John's Wort °-red yeast rice °This medicine may also interact with the following medications: °-acetaminophen °-agents that lower cholesterol °-alcohol °-allopurinol °-amiodarone °-antibiotics or medicines for treating bacterial, fungal or viral infections °-azathioprine °-barbiturate medicines for inducing sleep or treating seizures °-certain medicines for diabetes °-certain medicines for heart rhythm problems °-certain medicines for high blood pressure °-chloral hydrate °-cisapride °-disulfiram °-male hormones, including contraceptive or birth control pills °-general anesthetics °-herbal or dietary products like garlic, ginkgo, ginseng, green tea, or   kava kava °-influenza virus vaccine °-male hormones °-medicines for mental depression or psychosis °-medicines for some types of cancer °-medicines for stomach  problems °-methylphenidate °-NSAIDs, medicines for pain and inflammation, like ibuprofen or naproxen °-propoxyphene °-quinidine, quinine °-raloxifene °-seizure or epilepsy medicine like carbamazepine, phenytoin, and valproic acid °-steroids like cortisone and prednisone °-tamoxifen °-thyroid medicine °-tramadol °-vitamin c, vitamin e, and vitamin K °-zafirlukast °-zileuton °This list may not describe all possible interactions. Give your health care provider a list of all the medicines, herbs, non-prescription drugs, or dietary supplements you use. Also tell them if you smoke, drink alcohol, or use illegal drugs. Some items may interact with your medicine. °What should I watch for while using this medicine? °Visit your doctor or health care professional for regular checks on your progress. You will need to have a blood test called a PT/INR regularly. The PT/INR blood test is done to make sure you are getting the right dose of this medicine. It is important to not miss your appointment for the blood tests. When you first start taking this medicine, these tests are done often. Once the correct dose is determined and you take your medicine properly, these tests can be done less often. °Notify your doctor or health care professional and seek emergency treatment if you develop breathing problems; changes in vision; chest pain; severe, sudden headache; pain, swelling, warmth in the leg; trouble speaking; sudden numbness or weakness of the face, arm or leg. These can be signs that your condition has gotten worse. °While you are taking this medicine, carry an identification card with your name, the name and dose of medicine(s) being used, and the name and phone number of your doctor or health care professional or person to contact in an emergency. °Do not start taking or stop taking any medicines or over-the-counter medicines except on the advice of your doctor or health care professional. °You should discuss your diet with  your doctor or health care professional. Do not make major changes in your diet. Vitamin K can affect how well this medicine works. Many foods contain vitamin K. It is important to eat a consistent amount of foods with vitamin K. Other foods with vitamin K that you should eat in consistent amounts are asparagus, basil, beef or pork liver, black eyed peas, broccoli, brussel sprouts, cabbage, chickpeas, cucumber with peel, green onions, green tea, okra, parsley, peas, thyme, and green leafy vegetables like beet greens, collard greens, endive, kale, mustard greens, spinach, turnip greens, watercress, or certain lettuces like green leaf or romaine. °This medicine can cause birth defects or bleeding in an unborn child. Women of childbearing age should use effective birth control while taking this medicine. If a woman becomes pregnant while taking this medicine, she should discuss the potential risks and her options with her health care professional. °Avoid sports and activities that might cause injury while you are using this medicine. Severe falls or injuries can cause unseen bleeding. Be careful when using sharp tools or knives. Consider using an electric razor. Take special care brushing or flossing your teeth. Report any injuries, bruising, or red spots on the skin to your doctor or health care professional. °If you have an illness that causes vomiting, diarrhea, or fever for more than a few days, contact your doctor. Also check with your doctor if you are unable to eat for several days. These problems can change the effect of this medicine. °Even after you stop taking this medicine, it takes several days before your body   recovers its normal ability to clot blood. Ask your doctor or health care professional how long you need to be careful. If you are going to have surgery or dental work, tell your doctor or health care professional that you have been taking this medicine. °What side effects may I notice from  receiving this medicine? °Side effects that you should report to your doctor or health care professional as soon as possible: °-back pain °-chills °-dizziness °-fever °-heavy menstrual bleeding or vaginal bleeding °-painful, blue, or purple toes °-painful, prolonged erection °-signs and symptoms of bleeding such as bloody or black, tarry stools; red or dark-brown urine; spitting up blood or brown material that looks like coffee grounds; red spots on the skin; unusual bruising or bleeding from the eye, gums, or nose-skin rash, itching or skin damage °-stomach pain °-unusually weak or tired °-yellowing of skin or eyes °Side effects that usually do not require medical attention (report to your doctor or health care professional if they continue or are bothersome): °-diarrhea °-hair loss °This list may not describe all possible side effects. Call your doctor for medical advice about side effects. You may report side effects to FDA at 1-800-FDA-1088. °Where should I keep my medicine? °Keep out of the reach of children. °Store at room temperature between 15 and 30 degrees C (59 and 86 degrees F). Protect from light. Throw away any unused medicine after the expiration date. Do not flush down the toilet. °NOTE: This sheet is a summary. It may not cover all possible information. If you have questions about this medicine, talk to your doctor, pharmacist, or health care provider. °© 2014, [employer]. (2013-01-20 12:17:56) ° °

## 2013-10-11 ENCOUNTER — Other Ambulatory Visit: Payer: BC Managed Care – PPO

## 2013-10-12 ENCOUNTER — Ambulatory Visit: Payer: BC Managed Care – PPO | Attending: Internal Medicine | Admitting: Pharmacist

## 2013-10-12 ENCOUNTER — Other Ambulatory Visit: Payer: BC Managed Care – PPO

## 2013-10-12 DIAGNOSIS — I82409 Acute embolism and thrombosis of unspecified deep veins of unspecified lower extremity: Secondary | ICD-10-CM | POA: Insufficient documentation

## 2013-10-12 LAB — POCT INR: INR: 1.6

## 2013-10-12 MED ORDER — WARFARIN SODIUM 5 MG PO TABS: ORAL_TABLET | ORAL | Status: DC

## 2013-10-12 NOTE — Patient Instructions (Signed)
Pt is to continue Xarelto 15 mg and Warfarin 10 mg today and 7.5 mg daily and to return in 1 week.

## 2013-10-12 NOTE — Progress Notes (Deleted)
Anti-Coagulation Progress Note  Scott GuthrieJoseph T Mckillop is a 53 y.o. male who is currently on an anti-coagulation regimen.    RECENT RESULTS: Recent results are below, the most recent result is correlated with a dose of *** mg. per week: Lab Results  Component Value Date   INR 1.6 10/12/2013   INR 1.5 10/04/2013   INR 1.3 09/22/2013    ANTI-COAG DOSE: Xarelto 15 mg and Warfarin 5 mg    ANTICOAG SUMMARY:  Pt did not increase Warfarin per last visit.  He has only been taking 5 mg daily.   ANTICOAG TODAY: 1.6   PATIENT INSTRUCTIONS: Pt was instructed to continue Xarelto 15 mg and increase Warfarin to 10 mg today, then 7.5 mg daily per protocol.     FOLLOW-UP Return to clinic in 1 week.

## 2013-10-19 ENCOUNTER — Ambulatory Visit: Payer: BC Managed Care – PPO | Attending: Internal Medicine | Admitting: Pharmacist

## 2013-10-19 DIAGNOSIS — I82409 Acute embolism and thrombosis of unspecified deep veins of unspecified lower extremity: Secondary | ICD-10-CM

## 2013-10-19 LAB — POCT INR: INR: 1.6

## 2013-10-26 ENCOUNTER — Encounter: Payer: BC Managed Care – PPO | Admitting: Pharmacist

## 2013-10-28 ENCOUNTER — Ambulatory Visit: Payer: BC Managed Care – PPO | Attending: Internal Medicine | Admitting: Pharmacist

## 2013-10-28 DIAGNOSIS — Z5181 Encounter for therapeutic drug level monitoring: Secondary | ICD-10-CM | POA: Diagnosis not present

## 2013-10-28 DIAGNOSIS — I82409 Acute embolism and thrombosis of unspecified deep veins of unspecified lower extremity: Secondary | ICD-10-CM | POA: Insufficient documentation

## 2013-10-28 LAB — POCT INR: INR: 2.3

## 2013-10-28 MED ORDER — WARFARIN SODIUM 5 MG PO TABS: ORAL_TABLET | ORAL | Status: DC

## 2013-11-08 ENCOUNTER — Emergency Department (HOSPITAL_COMMUNITY)
Admission: EM | Admit: 2013-11-08 | Discharge: 2013-11-08 | Disposition: A | Discharge status: Home / Self Care | Payer: BC Managed Care – PPO | Source: Home / Self Care

## 2013-11-08 ENCOUNTER — Encounter (HOSPITAL_COMMUNITY): Payer: Self-pay | Admitting: Emergency Medicine

## 2013-11-08 DIAGNOSIS — M5431 Sciatica, right side: Secondary | ICD-10-CM

## 2013-11-08 DIAGNOSIS — M5430 Sciatica, unspecified side: Secondary | ICD-10-CM

## 2013-11-08 DIAGNOSIS — M25559 Pain in unspecified hip: Secondary | ICD-10-CM

## 2013-11-08 MED ORDER — TRAMADOL HCL 50 MG PO TABS: 50.0000 mg | ORAL_TABLET | Freq: Two times a day (BID) | ORAL | Status: DC | PRN

## 2013-11-08 MED ORDER — CYCLOBENZAPRINE HCL 5 MG PO TABS: 5.0000 mg | ORAL_TABLET | Freq: Three times a day (TID) | ORAL | Status: DC | PRN

## 2013-11-08 NOTE — ED Notes (Signed)
C/o  Right hip pain for several months.  Gradually gotten worse.  More severe pain felt today.  Denies any known injury.  Pt has not tried any otc pain meds.

## 2013-11-08 NOTE — ED Provider Notes (Signed)
CSN: [id number]     Arrival date & time 11/08/13  1330 History   None    Chief Complaint  Patient presents with  . Hip Pain   (Consider location/radiation/quality/duration/timing/severity/associated sxs/prior Treatment)  HPI  Patient states he has a history of right hip pain that has been bothering him "on and off for several months". Patient states that it has gradually gotten worse and is most severe today. Denies any injury, nor has he sought any treatment from other providers.  Past Medical History  Diagnosis Date  . Costochondritis    Past Surgical History  Procedure Laterality Date  . Joint replacement      knee surgery  . Back surgery     History reviewed. No pertinent family history. History  Substance Use Topics  . Smoking status: Current Every Day Smoker  . Smokeless tobacco: Not on file  . Alcohol Use: Yes    Review of Systems  Constitutional: Negative.  Negative for fever, chills and fatigue.  HENT: Negative.   Eyes: Negative.   Respiratory: Negative.   Cardiovascular: Negative.   Gastrointestinal: Negative.   Endocrine: Negative.   Genitourinary: Negative.   Musculoskeletal: Positive for arthralgias, back pain and gait problem. Negative for joint swelling and myalgias.  Skin: Negative.   Allergic/Immunologic: Negative.   Neurological: Negative.  Negative for tremors, weakness and numbness.  Hematological: Negative.   Psychiatric/Behavioral: Negative.    The patient states he had a discectomy "a number of years ago".  The patient states he has not sought any local evaluation and treatment and is unsure who surgeon is that actually performed the original operation. Allergies  Thimerosal  Home Medications   Prior to Admission medications   Medication Sig Start Date End Date Taking? Authorizing Provider  warfarin (COUMADIN) 5 MG tablet TAKE AS DIRECTED BY PHYSICIAN 10/28/13  Yes Jeanann Lewandowskylugbemiga Jegede, MD   BP 118/71  Pulse 90  Temp(Src) 98.7 F (37.1 C)  (Oral)  Resp 18  SpO2 98%  Physical Exam  Nursing note and vitals reviewed. Constitutional: He is oriented to person, place, and time. He appears well-developed and well-nourished. No distress.  Cardiovascular: Normal rate, regular rhythm, normal heart sounds and intact distal pulses.  Exam reveals no gallop and no friction rub.   No murmur heard. Pulmonary/Chest: Effort normal and breath sounds normal. No respiratory distress. He has no wheezes. He has no rales. He exhibits no tenderness.  Musculoskeletal: Normal range of motion. He exhibits tenderness. He exhibits no edema.       Right hip: He exhibits tenderness. He exhibits normal range of motion, normal strength, no bony tenderness, no swelling, no crepitus, no deformity and no laceration.       Legs: Strength is 5/5 to all extremities.  Neurological: He is alert and oriented to person, place, and time. He displays normal reflexes. No cranial nerve deficit. He exhibits normal muscle tone. Coordination normal.  Skin: Skin is warm and dry. No rash noted. He is not diaphoretic. No erythema. No pallor.   The patient is able to a newly to the treatment area without assistance. Patient is seated on the examination table with mild distress. There is no evidence of surface trauma, but a well-healed scar is noted in lower back.  There is no tenderness along the paraspinal muscles or bony prominences.  Significant discomfort noted over the sciatic nerve.  Right straight leg raise is positive for radiculopathy.  There is change in bowel or bladder habits.  Discomfort is consistent  with an L5/S1 distribution.  ED Course  Procedures (including critical care time) Labs Review Labs Reviewed - No data to display  Imaging Review No results found.   MDM   1. Hip pain   2. Right sciatic nerve pain    Meds ordered this encounter  Medications  . traMADol (ULTRAM) 50 MG tablet    Sig: Take 1 tablet (50 mg total) by mouth every 12 (twelve) hours as  needed.    Dispense:  20 tablet    Refill:  0  . cyclobenzaprine (FLEXERIL) 5 MG tablet    Sig: Take 1 tablet (5 mg total) by mouth 3 (three) times daily as needed for muscle spasms.    Dispense:  30 tablet    Refill:  0   The patient verbalizes understanding and agrees to plan of care.       Weber Cooksatherine Savvas Roper, NP 11/08/13 2145

## 2013-11-08 NOTE — Discharge Instructions (Signed)
Take aleve twice daily for pain and inflammation.  Use tramadol for severe pain and cyclobenzaprine to help with rest at night.    Hip Pain The hips join the upper legs to the lower pelvis. The bones, cartilage, tendons, and muscles of the hip joint perform a lot of work each day holding your body weight and allowing you to move around. Hip pain is a common symptom. It can range from a minor ache to severe pain on 1 or both hips. Pain may be felt on the inside of the hip joint near the groin, or the outside near the buttocks and upper thigh. There may be swelling or stiffness as well. It occurs more often when a person walks or performs activity. There are many reasons hip pain can develop. CAUSES  It is important to work with your caregiver to identify the cause since many conditions can impact the bones, cartilage, muscles, and tendons of the hips. Causes for hip pain include:  Broken (fractured) bones.  Separation of the thighbone from the hip socket (dislocation).  Torn cartilage of the hip joint.  Swelling (inflammation) of a tendon (tendonitis), the sac within the hip joint (bursitis), or a joint.  A weakening in the abdominal wall (hernia), affecting the nerves to the hip.  Arthritis in the hip joint or lining of the hip joint.  Pinched nerves in the back, hip, or upper thigh.  A bulging disc in the spine (herniated disc).  Rarely, bone infection or cancer. DIAGNOSIS  The location of your hip pain will help your caregiver understand what may be causing the pain. A diagnosis is based on your medical history, your symptoms, results from your physical exam, and results from diagnostic tests. Diagnostic tests may include X-ray exams, a computerized magnetic scan (magnetic resonance imaging, MRI), or bone scan. TREATMENT  Treatment will depend on the cause of your hip pain. Treatment may include:  Limiting activities and resting until symptoms improve.  Crutches or other walking  supports (a cane or brace).  Ice, elevation, and compression.  Physical therapy or home exercises.  Shoe inserts or special shoes.  Losing weight.  Medications to reduce pain.  Undergoing surgery. HOME CARE INSTRUCTIONS   Only take over-the-counter or prescription medicines for pain, discomfort, or fever as directed by your caregiver.  Put ice on the injured area:  Put ice in a plastic bag.  Place a towel between your skin and the bag.  Leave the ice on for 15-20 minutes at a time, 03-04 times a day.  Keep your leg raised (elevated) when possible to lessen swelling.  Avoid activities that cause pain.  Follow specific exercises as directed by your caregiver.  Sleep with a pillow between your legs on your most comfortable side.  Record how often you have hip pain, the location of the pain, and what it feels like. This information may be helpful to you and your caregiver.  Ask your caregiver about returning to work or sports and whether you should drive.  Follow up with your caregiver for further exams, therapy, or testing as directed. SEEK MEDICAL CARE IF:   Your pain or swelling continues or worsens after 1 week.  You are feeling unwell or have chills.  You have increasing difficulty with walking.  You have a loss of sensation or other new symptoms.  You have questions or concerns. SEEK IMMEDIATE MEDICAL CARE IF:   You cannot put weight on the affected hip.  You have fallen.  You  have a sudden increase in pain and swelling in your hip.  You have a fever. MAKE SURE YOU:   Understand these instructions.  Will watch your condition.  Will get help right away if you are not doing well or get worse. Document Released: 12/19/2009 Document Revised: 09/23/2011 Document Reviewed: 12/19/2009 [facility] Patient Information 2014 [place].

## 2013-11-09 ENCOUNTER — Ambulatory Visit: Payer: BC Managed Care – PPO | Attending: Internal Medicine | Admitting: Pharmacist

## 2013-11-09 DIAGNOSIS — Z5181 Encounter for therapeutic drug level monitoring: Secondary | ICD-10-CM | POA: Insufficient documentation

## 2013-11-09 DIAGNOSIS — I82409 Acute embolism and thrombosis of unspecified deep veins of unspecified lower extremity: Secondary | ICD-10-CM

## 2013-11-09 LAB — POCT INR: INR: 2.5

## 2013-11-09 NOTE — ED Provider Notes (Signed)
Medical screening examination/treatment/procedure(s) were performed by resident physician or non-physician practitioner and as supervising physician I was immediately available for consultation/collaboration.   Atziry Baranski DOUGLAS MD.   Val Schiavo D Wyn Nettle, MD 11/09/13 0832 

## 2013-11-29 ENCOUNTER — Ambulatory Visit: Payer: BC Managed Care – PPO | Attending: Internal Medicine

## 2013-11-29 ENCOUNTER — Ambulatory Visit: Payer: BC Managed Care – PPO | Admitting: *Deleted

## 2013-11-29 VITALS — BP 143/73 | HR 96 | Temp 98.8°F | Resp 18 | Ht 72.0 in | Wt 222.8 lb

## 2013-11-29 DIAGNOSIS — Z5181 Encounter for therapeutic drug level monitoring: Secondary | ICD-10-CM

## 2013-11-29 DIAGNOSIS — Z7901 Long term (current) use of anticoagulants: Secondary | ICD-10-CM

## 2013-11-29 DIAGNOSIS — I82409 Acute embolism and thrombosis of unspecified deep veins of unspecified lower extremity: Secondary | ICD-10-CM

## 2013-11-29 DIAGNOSIS — Z7982 Long term (current) use of aspirin: Principal | ICD-10-CM

## 2013-11-29 DIAGNOSIS — M25569 Pain in unspecified knee: Secondary | ICD-10-CM

## 2013-11-29 LAB — POCT INR
INR: 3.4
INR: 3.5

## 2013-11-29 MED ORDER — TRAMADOL HCL 50 MG PO TABS: 50.0000 mg | ORAL_TABLET | Freq: Three times a day (TID) | ORAL | Status: DC | PRN

## 2013-11-29 NOTE — Progress Notes (Unsigned)
Patient in today with a bruise on right leg. Patient states he bumped into a piece of plywood on Thursday of last week. Area is bruised, raised, and painful per patient. Dr. Hyman HopesJegede assessed patient's leg to make sure there was no active bleeding. Dr. Hyman HopesJegede informed patient to stop Coumadin today (11/29/2013) and tomorrow (11/30/2013) and then resume normal schedule. Reather LaurenceJamie R Velvet Moomaw, RN

## 2013-11-29 NOTE — Patient Instructions (Signed)
Ice the affected area about every 4-6 hours. Cryotherapy Cryotherapy means treatment with cold. Ice or gel packs can be used to reduce both pain and swelling. Ice is the most helpful within the first 24 to 48 hours after an injury or flareup from overusing a muscle or joint. Sprains, strains, spasms, burning pain, shooting pain, and aches can all be eased with ice. Ice can also be used when recovering from surgery. Ice is effective, has very few side effects, and is safe for most people to use. PRECAUTIONS  Ice is not a safe treatment option for people with:  Raynaud's phenomenon. This is a condition affecting small blood vessels in the extremities. Exposure to cold may cause your problems to return.  Cold hypersensitivity. There are many forms of cold hypersensitivity, including:  Cold urticaria. Red, itchy hives appear on the skin when the tissues begin to warm after being iced.  Cold erythema. This is a red, itchy rash caused by exposure to cold.  Cold hemoglobinuria. Red blood cells break down when the tissues begin to warm after being iced. The hemoglobin that carry oxygen are passed into the urine because they cannot combine with blood proteins fast enough.  Numbness or altered sensitivity in the area being iced. If you have any of the following conditions, do not use ice until you have discussed cryotherapy with your caregiver:  Heart conditions, such as arrhythmia, angina, or chronic heart disease.  High blood pressure.  Healing wounds or open skin in the area being iced.  Current infections.  Rheumatoid arthritis.  Poor circulation.  Diabetes. Ice slows the blood flow in the region it is applied. This is beneficial when trying to stop inflamed tissues from spreading irritating chemicals to surrounding tissues. However, if you expose your skin to cold temperatures for too long or without the proper protection, you can damage your skin or nerves. Watch for signs of skin damage  due to cold. HOME CARE INSTRUCTIONS Follow these tips to use ice and cold packs safely.  Place a dry or damp towel between the ice and skin. A damp towel will cool the skin more quickly, so you may need to shorten the time that the ice is used.  For a more rapid response, add gentle compression to the ice.  Ice for no more than 10 to 20 minutes at a time. The bonier the area you are icing, the less time it will take to get the benefits of ice.  Check your skin after 5 minutes to make sure there are no signs of a poor response to cold or skin damage.  Rest 20 minutes or more in between uses.  Once your skin is numb, you can end your treatment. You can test numbness by very lightly touching your skin. The touch should be so light that you do not see the skin dimple from the pressure of your fingertip. When using ice, most people will feel these normal sensations in this order: cold, burning, aching, and numbness.  Do not use ice on someone who cannot communicate their responses to pain, such as small children or people with dementia. HOW TO MAKE AN ICE PACK Ice packs are the most common way to use ice therapy. Other methods include ice massage, ice baths, and cryo-sprays. Muscle creams that cause a cold, tingly feeling do not offer the same benefits that ice offers and should not be used as a substitute unless recommended by your caregiver. To make an ice pack, do one  of the following:  Place crushed ice or a bag of frozen vegetables in a sealable plastic bag. Squeeze out the excess air. Place this bag inside another plastic bag. Slide the bag into a pillowcase or place a damp towel between your skin and the bag.  Mix 3 parts water with 1 part rubbing alcohol. Freeze the mixture in a sealable plastic bag. When you remove the mixture from the freezer, it will be slushy. Squeeze out the excess air. Place this bag inside another plastic bag. Slide the bag into a pillowcase or place a damp towel  between your skin and the bag. SEEK MEDICAL CARE IF:  You develop white spots on your skin. This may give the skin a blotchy (mottled) appearance.  Your skin turns blue or pale.  Your skin becomes waxy or hard.  Your swelling gets worse. MAKE SURE YOU:   Understand these instructions.  Will watch your condition.  Will get help right away if you are not doing well or get worse. Document Released: 02/25/2011 Document Revised: 09/23/2011 Document Reviewed: 02/25/2011 [facility] Patient Information 2014 [place].

## 2013-11-30 ENCOUNTER — Encounter: Payer: BC Managed Care – PPO | Admitting: Pharmacist

## 2013-12-07 ENCOUNTER — Encounter: Payer: BC Managed Care – PPO | Admitting: Pharmacist

## 2014-01-04 ENCOUNTER — Telehealth: Payer: Self-pay | Admitting: Internal Medicine

## 2014-01-04 NOTE — Telephone Encounter (Signed)
Pt has called in to request an ultrasound; pt states that he was scheduled to receive one last week but was unable to keep the appointment; pt has not been on his regular coumadin regiment

## 2014-01-07 ENCOUNTER — Other Ambulatory Visit: Payer: BC Managed Care – PPO

## 2014-02-02 ENCOUNTER — Telehealth: Payer: Self-pay | Admitting: *Deleted

## 2014-02-02 NOTE — Telephone Encounter (Signed)
I spoke to the pt and I informed him that he needed to make another appointment in order for the doctor to reorder an ultrasound. I transferred his call to the schedulers.

## 2014-02-22 ENCOUNTER — Ambulatory Visit: Payer: BC Managed Care – PPO | Admitting: Internal Medicine

## 2014-03-03 ENCOUNTER — Encounter: Payer: Self-pay | Admitting: Internal Medicine

## 2014-03-03 ENCOUNTER — Ambulatory Visit: Payer: BC Managed Care – PPO | Attending: Internal Medicine | Admitting: Internal Medicine

## 2014-03-03 VITALS — BP 142/92 | HR 78 | Temp 98.5°F | Resp 18 | Ht 72.0 in | Wt 231.0 lb

## 2014-03-03 DIAGNOSIS — M940 Chondrocostal junction syndrome [Tietze]: Secondary | ICD-10-CM | POA: Diagnosis not present

## 2014-03-03 DIAGNOSIS — Z1211 Encounter for screening for malignant neoplasm of colon: Secondary | ICD-10-CM

## 2014-03-03 DIAGNOSIS — Z888 Allergy status to other drugs, medicaments and biological substances status: Secondary | ICD-10-CM | POA: Insufficient documentation

## 2014-03-03 DIAGNOSIS — Z7901 Long term (current) use of anticoagulants: Secondary | ICD-10-CM | POA: Diagnosis not present

## 2014-03-03 DIAGNOSIS — I82409 Acute embolism and thrombosis of unspecified deep veins of unspecified lower extremity: Secondary | ICD-10-CM | POA: Insufficient documentation

## 2014-03-03 DIAGNOSIS — I82401 Acute embolism and thrombosis of unspecified deep veins of right lower extremity: Secondary | ICD-10-CM

## 2014-03-03 LAB — POCT INR: INR: 1.1

## 2014-03-03 LAB — POCT GLYCOSYLATED HEMOGLOBIN (HGB A1C): Hemoglobin A1C: 5.3

## 2014-03-03 MED ORDER — WARFARIN SODIUM 5 MG PO TABS: ORAL_TABLET | ORAL | Status: DC

## 2014-03-03 NOTE — Patient Instructions (Signed)

## 2014-03-03 NOTE — Progress Notes (Signed)
Patient ID: Scott Quinn, male   DOB: [birth year], 53 y.o.   MRN: [id number]   [name], is a 53 y.o. male  [id number]  [id number]  DOB - [birth year]  Chief Complaint  Patient presents with  . Follow-up  . DVT        Subjective:   Scott Quinn is a 53 y.o. male here today for a follow up visit. Patient has No headache, No chest pain, No abdominal pain - No Nausea, No new weakness tingling or numbness, No Cough - SOB.  Problem  Colon Cancer Screening    ALLERGIES: Allergies  Allergen Reactions  . Thimerosal     PAST MEDICAL HISTORY: Past Medical History  Diagnosis Date  . Costochondritis     MEDICATIONS AT HOME: Prior to Admission medications   Medication Sig Start Date End Date Taking? Authorizing Provider  cyclobenzaprine (FLEXERIL) 5 MG tablet Take 1 tablet (5 mg total) by mouth 3 (three) times daily as needed for muscle spasms. 11/08/13   Servando Salina, NP  traMADol (ULTRAM) 50 MG tablet Take 1 tablet (50 mg total) by mouth every 8 (eight) hours as needed. 11/29/13   Jeanann Lewandowsky, MD  warfarin (COUMADIN) 5 MG tablet TAKE AS DIRECTED BY PHYSICIAN 03/03/14   Jeanann Lewandowsky, MD     Objective:   Filed Vitals:   03/03/14 1505  BP: 142/92  Pulse: 78  Temp: 98.5 F (36.9 C)  TempSrc: Oral  Resp: 18  Height: 6' (1.829 m)  Weight: 231 lb (104.781 kg)  SpO2: 94%    Exam General appearance : Awake, alert, not in any distress. Speech Clear. Not toxic looking HEENT: Atraumatic and Normocephalic, pupils equally reactive to light and accomodation Neck: supple, no JVD. No cervical lymphadenopathy.  Chest:Good air entry bilaterally, no added sounds  CVS: S1 S2 regular, no murmurs.  Abdomen: Bowel sounds present, Non tender and not distended with no gaurding, rigidity or rebound. Extremities: B/L Lower Ext shows no edema, both legs are warm to touch Neurology: Awake alert, and oriented X 3, CN II-XII intact, Non focal Skin:No  Rash Wounds:N/A  Data Review No results found for this basename: HGBA1C     Assessment & Plan   1. DVT (deep venous thrombosis), unspecified laterality Patient has not been worked up for hypercoagulability, he has been off Coumadin for more than 2 months and I think we should proceed with testing, if negative he may not need to continue on anti-coagulation since he was on Coumadin for approximately 4 months before discontinuing  - CBC with Differential - COMPLETE METABOLIC PANEL WITH GFR - POCT glycosylated hemoglobin (Hb A1C) - Lipid panel - TSH - Urinalysis, Complete - Factor 5 leiden - Protein C, total - Protein S, total and functional panel - Homocysteine - Lupus anticoagulant  2. DVT (deep venous thrombosis), right  - warfarin (COUMADIN) 5 MG tablet; TAKE AS DIRECTED BY PHYSICIAN  Dispense: 60 tablet; Refill: 3 - Lower Extremity Venous Duplex Left; Future  3. Colon cancer screening  - [facility] COLONOSCOPY - Ambulatory referral to Gastroenterology  Patient was extensively counseled on nutrition and exercise Patient was again extensively counseled on smoking cessation to reduce his risks of developing complications including DVT in the future  Return in about 6 months (around 09/03/2014), or if symptoms worsen or fail to improve, for Follow up HTN.  The patient was given clear instructions to go to ER or return to medical center if symptoms don't improve, worsen or new problems develop.  The patient verbalized understanding. The patient was told to call to get lab results if they haven't heard anything in the next week.   This note has been created with Education officer, environmentalDragon speech recognition software and smart phrase technology. Any transcriptional errors are unintentional.    Jeanann LewandowskyJEGEDE, Brealyn Baril, MD, MHA, FACP, FAAP [facility] [address] [place], [place] [phone]   03/03/2014, 3:35 PM

## 2014-03-03 NOTE — Progress Notes (Signed)
Pt comes in for f/u DVT left leg Pt stopped taking Coumadin in July Denies pain or swelling States he need repeat u/s BP- 142/92 78 Pt due for Colonoscopy

## 2014-03-04 ENCOUNTER — Ambulatory Visit (HOSPITAL_COMMUNITY)
Admission: RE | Admit: 2014-03-04 | Discharge: 2014-03-04 | Disposition: A | Discharge status: Home / Self Care | Payer: BC Managed Care – PPO | Source: Ambulatory Visit | Attending: Internal Medicine | Admitting: Internal Medicine

## 2014-03-04 DIAGNOSIS — Z86718 Personal history of other venous thrombosis and embolism: Secondary | ICD-10-CM

## 2014-03-04 DIAGNOSIS — I82409 Acute embolism and thrombosis of unspecified deep veins of unspecified lower extremity: Secondary | ICD-10-CM | POA: Diagnosis not present

## 2014-03-04 DIAGNOSIS — I82401 Acute embolism and thrombosis of unspecified deep veins of right lower extremity: Secondary | ICD-10-CM

## 2014-03-04 LAB — URINALYSIS, COMPLETE
Bacteria, UA: NONE SEEN
Bilirubin Urine: NEGATIVE
CASTS: NONE SEEN
Crystals: NONE SEEN
GLUCOSE, UA: NEGATIVE mg/dL
Ketones, ur: NEGATIVE mg/dL
LEUKOCYTES UA: NEGATIVE
Nitrite: NEGATIVE
Protein, ur: NEGATIVE mg/dL
SQUAMOUS EPITHELIAL / LPF: NONE SEEN
Specific Gravity, Urine: 1.011 (ref 1.005–1.030)
Urobilinogen, UA: 0.2 mg/dL (ref 0.0–1.0)
pH: 7 (ref 5.0–8.0)

## 2014-03-04 LAB — CBC WITH DIFFERENTIAL/PLATELET
BASOS ABS: 0.1 10*3/uL (ref 0.0–0.1)
BASOS PCT: 1 % (ref 0–1)
EOS ABS: 0.1 10*3/uL (ref 0.0–0.7)
EOS PCT: 1 % (ref 0–5)
HCT: 55.5 % — ABNORMAL HIGH (ref 39.0–52.0)
Hemoglobin: 20.2 g/dL — ABNORMAL HIGH (ref 13.0–17.0)
LYMPHS ABS: 1.5 10*3/uL (ref 0.7–4.0)
Lymphocytes Relative: 21 % (ref 12–46)
MCH: 33.5 pg (ref 26.0–34.0)
MCHC: 36.4 g/dL — ABNORMAL HIGH (ref 30.0–36.0)
MCV: 92 fL (ref 78.0–100.0)
Monocytes Absolute: 0.8 10*3/uL (ref 0.1–1.0)
Monocytes Relative: 11 % (ref 3–12)
NEUTROS PCT: 66 % (ref 43–77)
Neutro Abs: 4.8 10*3/uL (ref 1.7–7.7)
PLATELETS: 127 10*3/uL — AB (ref 150–400)
RBC: 6.03 MIL/uL — AB (ref 4.22–5.81)
RDW: 14.2 % (ref 11.5–15.5)
WBC: 7.2 10*3/uL (ref 4.0–10.5)

## 2014-03-04 LAB — LIPID PANEL
CHOLESTEROL: 175 mg/dL (ref 0–200)
HDL: 49 mg/dL (ref >39)
LDL CALC: 86 mg/dL (ref 0–99)
TRIGLYCERIDES: 198 mg/dL — AB (ref <150)
Total CHOL/HDL Ratio: 3.6 Ratio
VLDL: 40 mg/dL (ref 0–40)

## 2014-03-04 LAB — COMPLETE METABOLIC PANEL WITH GFR
ALK PHOS: 64 U/L (ref 39–117)
ALT: 30 U/L (ref 0–53)
AST: 37 U/L (ref 0–37)
Albumin: 4.3 g/dL (ref 3.5–5.2)
BILIRUBIN TOTAL: 0.7 mg/dL (ref 0.2–1.2)
BUN: 8 mg/dL (ref 6–23)
CO2: 27 meq/L (ref 19–32)
CREATININE: 0.66 mg/dL (ref 0.50–1.35)
Calcium: 10.3 mg/dL (ref 8.4–10.5)
Chloride: 104 mEq/L (ref 96–112)
GFR, Est African American: >89 mL/min
GFR, Est Non African American: >89 mL/min
Glucose, Bld: 112 mg/dL — ABNORMAL HIGH (ref 70–99)
Potassium: 4.3 mEq/L (ref 3.5–5.3)
SODIUM: 140 meq/L (ref 135–145)
TOTAL PROTEIN: 6.7 g/dL (ref 6.0–8.3)

## 2014-03-04 LAB — PROTEIN S, TOTAL AND FUNCTIONAL PANEL
PROTEIN S ACTIVITY: 134 % — AB (ref 69–129)
PROTEIN S TOTAL: 104 % (ref 60–150)

## 2014-03-04 LAB — LUPUS ANTICOAGULANT PANEL
DRVVT 1:1 Mix: 39.2 secs (ref <42.9)
DRVVT: 49.3 secs — ABNORMAL HIGH (ref <42.9)
Lupus Anticoagulant: NOT DETECTED
PTT LA: 45 s — AB (ref 28.0–43.0)
PTTLA 41 MIX: 38 s (ref 28.0–43.0)

## 2014-03-04 LAB — HOMOCYSTEINE: HOMOCYSTEINE: 13.7 umol/L (ref 4.0–15.4)

## 2014-03-04 LAB — PROTEIN C, TOTAL: Protein C, Total: 71 % — ABNORMAL LOW (ref 72–160)

## 2014-03-04 LAB — TSH: TSH: 1.958 u[IU]/mL (ref 0.350–4.500)

## 2014-03-04 NOTE — Progress Notes (Signed)
*  Preliminary Results* Left lower extremity venous duplex completed. Left lower extremity is negative for deep vein thrombosis. There is no evidence of left Baker's cyst.  Attempted to call preliminary results to [phone], however there was no answer.  03/04/2014 3:38 PM  Gertie FeyMichelle Dantonio Justen, RVT, RDCS, RDMS

## 2014-03-07 ENCOUNTER — Telehealth: Payer: Self-pay | Admitting: Emergency Medicine

## 2014-03-07 LAB — FACTOR 5 LEIDEN

## 2014-03-07 NOTE — Telephone Encounter (Signed)
Pt given negative ultrasound. Pt has already discontinued taking blood thinner

## 2014-03-07 NOTE — Telephone Encounter (Signed)
Message copied by Darlis Loan on Mon Mar 07, 2014  4:17 PM ------      Message from: Jeanann Lewandowsky E      Created: Mon Mar 07, 2014 12:06 PM       Please inform patient that his left lower leg ultrasound shows- No evidence of deep vein or superficial thrombosis, - No evidence of Baker&'s cyst on the left.       ------

## 2015-05-29 ENCOUNTER — Emergency Department (HOSPITAL_COMMUNITY)
Admission: EM | Admit: 2015-05-29 | Discharge: 2015-05-29 | Disposition: A | Discharge status: Home / Self Care | Payer: Self-pay | Source: Home / Self Care

## 2015-05-29 ENCOUNTER — Emergency Department (HOSPITAL_COMMUNITY)
Admission: EM | Admit: 2015-05-29 | Discharge: 2015-05-30 | Disposition: A | Discharge status: Home / Self Care | Payer: Self-pay | Attending: Emergency Medicine | Admitting: Emergency Medicine

## 2015-05-29 ENCOUNTER — Encounter (HOSPITAL_COMMUNITY): Payer: Self-pay | Admitting: Emergency Medicine

## 2015-05-29 DIAGNOSIS — F17200 Nicotine dependence, unspecified, uncomplicated: Secondary | ICD-10-CM | POA: Insufficient documentation

## 2015-05-29 DIAGNOSIS — Z86718 Personal history of other venous thrombosis and embolism: Secondary | ICD-10-CM | POA: Insufficient documentation

## 2015-05-29 DIAGNOSIS — L03116 Cellulitis of left lower limb: Secondary | ICD-10-CM | POA: Insufficient documentation

## 2015-05-29 DIAGNOSIS — L0390 Cellulitis, unspecified: Secondary | ICD-10-CM

## 2015-05-29 DIAGNOSIS — L0291 Cutaneous abscess, unspecified: Secondary | ICD-10-CM

## 2015-05-29 DIAGNOSIS — L02416 Cutaneous abscess of left lower limb: Secondary | ICD-10-CM | POA: Insufficient documentation

## 2015-05-29 LAB — CBC WITH DIFFERENTIAL/PLATELET
BASOS ABS: 0 10*3/uL (ref 0.0–0.1)
Basophils Relative: 1 %
Eosinophils Absolute: 0.2 10*3/uL (ref 0.0–0.7)
Eosinophils Relative: 2 %
HEMATOCRIT: 51.8 % (ref 39.0–52.0)
Hemoglobin: 18.4 g/dL — ABNORMAL HIGH (ref 13.0–17.0)
LYMPHS ABS: 2.1 10*3/uL (ref 0.7–4.0)
LYMPHS PCT: 26 %
MCH: 33.6 pg (ref 26.0–34.0)
MCHC: 35.5 g/dL (ref 30.0–36.0)
MCV: 94.5 fL (ref 78.0–100.0)
MONO ABS: 0.8 10*3/uL (ref 0.1–1.0)
Monocytes Relative: 9 %
NEUTROS ABS: 5.2 10*3/uL (ref 1.7–7.7)
Neutrophils Relative %: 62 %
Platelets: 176 10*3/uL (ref 150–400)
RBC: 5.48 MIL/uL (ref 4.22–5.81)
RDW: 12.3 % (ref 11.5–15.5)
WBC: 8.2 10*3/uL (ref 4.0–10.5)

## 2015-05-29 LAB — BASIC METABOLIC PANEL
ANION GAP: 12 (ref 5–15)
BUN: 8 mg/dL (ref 6–20)
CO2: 27 mmol/L (ref 22–32)
Calcium: 9.8 mg/dL (ref 8.9–10.3)
Chloride: 100 mmol/L — ABNORMAL LOW (ref 101–111)
Creatinine, Ser: 0.81 mg/dL (ref 0.61–1.24)
GFR calc Af Amer: >60 mL/min (ref >60)
GFR calc non Af Amer: >60 mL/min (ref >60)
GLUCOSE: 188 mg/dL — AB (ref 65–99)
POTASSIUM: 4.1 mmol/L (ref 3.5–5.1)
Sodium: 139 mmol/L (ref 135–145)

## 2015-05-29 NOTE — ED Provider Notes (Signed)
By signing my name below, I, Lyndel Safe, attest that this documentation has been prepared under the direction and in the presence of Keyarah Mcroy N Arney Mayabb, DO. Electronically Signed: Lyndel Safe, ED Scribe. 05/29/2015. 12:17 AM.  TIME SEEN: 12:09 AM  CHIEF COMPLAINT: Cellulitis  HPI:  HPI Comments: Scott Quinn is a 54 y.o. 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 He endorses hitting his left ankle 1 week ago when he then placed a compression sock on his left foot and on removal of the compression sock yesterday he noticed an abscess with surrounding erythema to the area. Pt notes the wound began to drain a minimal bloody drainage today while in the shower and has been constantly draining since presenting to the ED. The associated pain is exacerbated with weight bearing but he is able to move his ankle without any pain. He is no longer taking blood thinning medication after DVT several years ago. Denies fevers or chills, nausea, vomiting, or diarrhea. He is not a diabetic.  PCP: Dr. Hyman Hopes   ROS: See HPI Constitutional: no fever  Eyes: no drainage  ENT: no runny nose   Cardiovascular:  no chest pain  Resp: no SOB  GI: no vomiting GU: no dysuria Integumentary: no rash  Allergy: no hives  Musculoskeletal: no leg swelling  Neurological: no slurred speech ROS otherwise negative  PAST MEDICAL HISTORY/PAST SURGICAL HISTORY:  Past Medical History  Diagnosis Date  . Costochondritis     MEDICATIONS:  Prior to Admission medications   Not on File    ALLERGIES:  Allergies  Allergen Reactions  . Thimerosal Other (See Comments)    Redness     SOCIAL HISTORY:  Social History  Substance Use Topics  . Smoking status: Current Every Day Smoker  . Smokeless tobacco: Not on file  . Alcohol Use: Yes     FAMILY HISTORY: No family history on file.  EXAM: BP 136/88 mmHg  Pulse 103  Temp(Src) 99.2 F (37.3 C) (Oral)  Resp 16  Ht 6' (1.829 m)  Wt 251 lb 2 oz (113.91 kg)  BMI 34.05 kg/m2  SpO2 98% CONSTITUTIONAL: Alert and oriented and responds appropriately to questions. Well-appearing; well-nourished, smiling, laughing, nontoxic, in no apparent distress HEAD: Normocephalic EYES: Conjunctivae clear, PERRL ENT: normal nose; no rhinorrhea; moist mucous membranes; pharynx without lesions noted NECK: Supple, no meningismus, no LAD  CARD: RRR; S1 and S2 appreciated; no murmurs, no clicks, no rubs, no gallops RESP: Normal chest excursion without splinting or tachypnea; breath sounds clear and equal bilaterally; no wheezes, no rhonchi, no rales, no hypoxia or respiratory distress, speaking full sentences ABD/GI: Normal bowel sounds; non-distended; soft, non-tender, no rebound, no guarding, no peritoneal signs BACK:  The back appears normal and is non-tender to palpation, there is no CVA tenderness EXT: Normal ROM in all joints; non-tender to palpation; no edema; normal capillary refill; no cyanosis, no calf tenderness or swelling, no joint effusions, patient has full range of motion in the left angle that is painless, he has an old surgical scar over the left dorsal foot from surgery over 20 years ago, there is no erythema or warmth or fluctuance or tenderness over his old surgical scar, 2+ DP pulses bilaterally, feet are warm and well-perfused SKIN: Normal color for age and race; warm; 2cm X 1cm fluctuant  area to lateral LLE that is proximal to the the ankle with 4-5 cm of surrounding erythema and warmth but does extend down to the ankle, no induration and drainage   NEURO: Moves all extremities equally, sensation to light touch intact diffusely, cranial nerves II through XII intact PSYCH: The patient's mood and manner are appropriate. Grooming and personal hygiene are appropriate.  MEDICAL  DECISION MAKING: Patient here with abscess and cellulitis. His labs are unremarkable. He is very well-appearing with normal vital signs. Was initially tachycardic but this improved prior to me seeing the patient. There is no sign of septic arthritis on exam. He is not immunocompromised. Labs unremarkable.  He has had a previous surgery to his left foot but the area of erythema and warmth does not extend to this area. It does go to the middle of the left ankle but again there is no joint effusion and he has full range of motion on exam that is completely painless. I was able to incise and drain the small abscess on the left leg with a small amount of purulent drainage. Have started him on Bactrim. I feel that is reasonable given he is so well-appearing without systemic symptoms that we can try oral antibiotics as an outpatient. He is comfortable with this plan. We have outlined the area of erythema with a tissue marker. We'll have him follow-up in the emergency department or with his PCP in 48 hours for recheck. I discussed at length with him return precautions. He verbalizes understanding and is comfortable with plan. Have also discharge him with prescription for pain medication.   INCISION AND DRAINAGE Performed by: Raelyn NumberWARD, Ashaya Raftery N Consent: Verbal consent obtained. Risks and benefits: risks, benefits and alternatives were discussed Type: abscess  Body area: Left lower leg  Anesthesia: local infiltration  Incision was made with a scalpel.  Local anesthetic: lidocaine 2 % with epinephrine  Anesthetic total: 6 ml  Complexity: complex Blunt dissection to break up loculations  Drainage: purulent  Drainage amount: Small    Patient tolerance: Patient tolerated the procedure well with no immediate complications.     I personally performed the services described in this documentation, which was scribed in my presence. The recorded information has been reviewed and is  accurate.     Layla MawKristen N Layton Tappan, DO 05/30/15 [phone]

## 2015-05-29 NOTE — ED Notes (Signed)
Pt. presents with left lower leg abscess with drainage and swelling onset yesterday swelling extending down the foot , denies fever or chills.

## 2015-05-30 ENCOUNTER — Ambulatory Visit (HOSPITAL_BASED_OUTPATIENT_CLINIC_OR_DEPARTMENT_OTHER)
Admission: RE | Admit: 2015-05-30 | Discharge: 2015-05-30 | Disposition: A | Discharge status: Home / Self Care | Payer: Commercial Managed Care - HMO | Source: Ambulatory Visit | Attending: Emergency Medicine | Admitting: Emergency Medicine

## 2015-05-30 DIAGNOSIS — M7989 Other specified soft tissue disorders: Secondary | ICD-10-CM

## 2015-05-30 DIAGNOSIS — Z86718 Personal history of other venous thrombosis and embolism: Secondary | ICD-10-CM

## 2015-05-30 MED ORDER — SULFAMETHOXAZOLE-TRIMETHOPRIM 800-160 MG PO TABS: 1.0000 | ORAL_TABLET | Freq: Two times a day (BID) | ORAL | Status: AC

## 2015-05-30 MED ORDER — HYDROCODONE-ACETAMINOPHEN 5-325 MG PO TABS
2.0000 | ORAL_TABLET | Freq: Once | ORAL | Status: AC
  Administered 2015-05-30: 2 via ORAL
  Filled 2015-05-30: qty 2

## 2015-05-30 MED ORDER — SULFAMETHOXAZOLE-TRIMETHOPRIM 800-160 MG PO TABS
1.0000 | ORAL_TABLET | Freq: Once | ORAL | Status: AC
  Administered 2015-05-30: 1 via ORAL
  Filled 2015-05-30: qty 1

## 2015-05-30 MED ORDER — ENOXAPARIN SODIUM 120 MG/0.8ML ~~LOC~~ SOLN
1.0000 mg/kg | Freq: Once | SUBCUTANEOUS | Status: AC
  Administered 2015-05-30: 115 mg via SUBCUTANEOUS
  Filled 2015-05-30: qty 0.8

## 2015-05-30 MED ORDER — LIDOCAINE-EPINEPHRINE (PF) 2 %-1:200000 IJ SOLN
10.0000 mL | Freq: Once | INTRAMUSCULAR | Status: AC
  Administered 2015-05-30: 10 mL via INTRADERMAL
  Filled 2015-05-30: qty 20

## 2015-05-30 MED ORDER — ONDANSETRON 4 MG PO TBDP
4.0000 mg | ORAL_TABLET | Freq: Once | ORAL | Status: AC
  Administered 2015-05-30: 4 mg via ORAL
  Filled 2015-05-30: qty 1

## 2015-05-30 MED ORDER — HYDROCODONE-ACETAMINOPHEN 5-325 MG PO TABS: 1.0000 | ORAL_TABLET | Freq: Four times a day (QID) | ORAL | Status: DC | PRN

## 2015-05-30 NOTE — Progress Notes (Signed)
VASCULAR LAB PRELIMINARY  PRELIMINARY  PRELIMINARY  PRELIMINARY  Left lower extremity venous duplex completed.    Preliminary report:  Left:  No evidence of DVT, superficial thrombosis, or Baker's cyst.  Ronni Osterberg, RVT 05/30/2015, 9:12 AM

## 2015-05-30 NOTE — Discharge Instructions (Signed)
Abscess °An abscess is an infected area that contains a collection of pus and debris. It can occur in almost any part of the body. An abscess is also known as a furuncle or boil. °CAUSES  °An abscess occurs when tissue gets infected. This can occur from blockage of oil or sweat glands, infection of hair follicles, or a minor injury to the skin. As the body tries to fight the infection, pus collects in the area and creates pressure under the skin. This pressure causes pain. People with weakened immune systems have difficulty fighting infections and get certain abscesses more often.  °SYMPTOMS °Usually an abscess develops on the skin and becomes a painful mass that is red, warm, and tender. If the abscess forms under the skin, you may feel a moveable soft area under the skin. Some abscesses break open (rupture) on their own, but most will continue to get worse without care. The infection can spread deeper into the body and eventually into the bloodstream, causing you to feel ill.  °DIAGNOSIS  °Your caregiver will take your medical history and perform a physical exam. A sample of fluid may also be taken from the abscess to determine what is causing your infection. °TREATMENT  °Your caregiver may prescribe antibiotic medicines to fight the infection. However, taking antibiotics alone usually does not cure an abscess. Your caregiver may need to make a small cut (incision) in the abscess to drain the pus. In some cases, gauze is packed into the abscess to reduce pain and to continue draining the area. °HOME CARE INSTRUCTIONS  °· Only take over-the-counter or prescription medicines for pain, discomfort, or fever as directed by your caregiver. °· If you were prescribed antibiotics, take them as directed. Finish them even if you start to feel better. °· If gauze is used, follow your caregiver's directions for changing the gauze. °· To avoid spreading the infection: °· Keep your draining abscess covered with a  bandage. °· Wash your hands well. °· Do not share personal care items, towels, or whirlpools with others. °· Avoid skin contact with others. °· Keep your skin and clothes clean around the abscess. °· Keep all follow-up appointments as directed by your caregiver. °SEEK MEDICAL CARE IF:  °· You have increased pain, swelling, redness, fluid drainage, or bleeding. °· You have muscle aches, chills, or a general ill feeling. °· You have a fever. °MAKE SURE YOU:  °· Understand these instructions. °· Will watch your condition. °· Will get help right away if you are not doing well or get worse. °  °This information is not intended to replace advice given to you by your health care provider. Make sure you discuss any questions you have with your health care provider. °  °Document Released: 04/10/2005 Document Revised: 12/31/2011 Document Reviewed: 09/13/2011 °Elsevier Interactive Patient Education ©2016 [employer] Inc[employer] ° °Cellulitis °Cellulitis is an infection of the skin and the tissue beneath it. The infected area is usually red and tender. Cellulitis occurs most often in the arms and lower legs.  °CAUSES  °Cellulitis is caused by bacteria that enter the skin through cracks or cuts in the skin. The most common types of bacteria that cause cellulitis are staphylococci and streptococci. °SIGNS AND SYMPTOMS  °· Redness and warmth. °· Swelling. °· Tenderness or pain. °· Fever. °DIAGNOSIS  °Your health care provider can usually determine what is wrong based on a physical exam. Blood tests may also be done. °TREATMENT  °Treatment usually involves taking an antibiotic medicine. °HOME CARE INSTRUCTIONS  °·   Take your antibiotic medicine as directed by your health care provider. Finish the antibiotic even if you start to feel better. °· Keep the infected arm or leg elevated to reduce swelling. °· Apply a warm cloth to the affected area up to 4 times per day to relieve pain. °· Take medicines only as directed by your health care  provider. °· Keep all follow-up visits as directed by your health care provider. °SEEK MEDICAL CARE IF:  °· You notice red streaks coming from the infected area. °· Your red area gets larger or turns dark in color. °· Your bone or joint underneath the infected area becomes painful after the skin has healed. °· Your infection returns in the same area or another area. °· You notice a swollen bump in the infected area. °· You develop new symptoms. °· You have a fever. °SEEK IMMEDIATE MEDICAL CARE IF:  °· You feel very sleepy. °· You develop vomiting or diarrhea. °· You have a general ill feeling (malaise) with muscle aches and pains. °  °This information is not intended to replace advice given to you by your health care provider. Make sure you discuss any questions you have with your health care provider. °  °Document Released: 04/10/2005 Document Revised: 03/22/2015 Document Reviewed: 09/16/2011 °Elsevier Interactive Patient Education ©2016 [employer] ° °Incision and Drainage °Incision and drainage is a procedure in which a sac-like structure (cystic structure) is opened and drained. The area to be drained usually contains material such as pus, fluid, or blood.  °LET YOUR CAREGIVER KNOW ABOUT:  °· Allergies to medicine. °· Medicines taken, including vitamins, herbs, eyedrops, over-the-counter medicines, and creams. °· Use of steroids (by mouth or creams). °· Previous problems with anesthetics or numbing medicines. °· History of bleeding problems or blood clots. °· Previous surgery. °· Other health problems, including diabetes and kidney problems. °· Possibility of pregnancy, if this applies. °RISKS AND COMPLICATIONS °· Pain. °· Bleeding. °· Scarring. °· Infection. °BEFORE THE PROCEDURE  °You may need to have an ultrasound or other imaging tests to see how large or deep your cystic structure is. Blood tests may also be used to determine if you have an infection or how severe the infection is. You may need to have a  tetanus shot. °PROCEDURE  °The affected area is cleaned with a cleaning fluid. The cyst area will then be numbed with a medicine (local anesthetic). A small incision will be made in the cystic structure. A syringe or catheter may be used to drain the contents of the cystic structure, or the contents may be squeezed out. The area will then be flushed with a cleansing solution. After cleansing the area, it is often gently packed with a gauze or another wound dressing. Once it is packed, it will be covered with gauze and tape or some other type of wound dressing.  °AFTER THE PROCEDURE  °· Often, you will be allowed to go home right after the procedure. °· You may be given antibiotic medicine to prevent or heal an infection. °· If the area was packed with gauze or some other wound dressing, you will likely need to come back in 1 to 2 days to get it removed. °· The area should heal in about 14 days. °  °This information is not intended to replace advice given to you by your health care provider. Make sure you discuss any questions you have with your health care provider. °  °Document Released: 12/25/2000 Document Revised: 12/31/2011 Document Reviewed: 08/26/2011 °  Elsevier Interactive Patient Education ©2016 [employer] Inc. ° °

## 2015-06-13 ENCOUNTER — Ambulatory Visit: Payer: Commercial Managed Care - HMO | Attending: Family Medicine | Admitting: Family Medicine

## 2015-06-13 ENCOUNTER — Encounter: Payer: Self-pay | Admitting: Family Medicine

## 2015-06-13 VITALS — BP 130/87 | HR 105 | Temp 98.2°F | Resp 13 | Ht 72.0 in | Wt 251.4 lb

## 2015-06-13 DIAGNOSIS — E119 Type 2 diabetes mellitus without complications: Secondary | ICD-10-CM | POA: Insufficient documentation

## 2015-06-13 DIAGNOSIS — Z887 Allergy status to serum and vaccine status: Secondary | ICD-10-CM | POA: Insufficient documentation

## 2015-06-13 DIAGNOSIS — F17200 Nicotine dependence, unspecified, uncomplicated: Secondary | ICD-10-CM | POA: Insufficient documentation

## 2015-06-13 DIAGNOSIS — Z86718 Personal history of other venous thrombosis and embolism: Secondary | ICD-10-CM | POA: Insufficient documentation

## 2015-06-13 DIAGNOSIS — L02419 Cutaneous abscess of limb, unspecified: Secondary | ICD-10-CM | POA: Insufficient documentation

## 2015-06-13 DIAGNOSIS — M940 Chondrocostal junction syndrome [Tietze]: Secondary | ICD-10-CM | POA: Insufficient documentation

## 2015-06-13 DIAGNOSIS — Z720 Tobacco use: Secondary | ICD-10-CM | POA: Diagnosis not present

## 2015-06-13 DIAGNOSIS — F1010 Alcohol abuse, uncomplicated: Secondary | ICD-10-CM | POA: Diagnosis not present

## 2015-06-13 LAB — POCT GLYCOSYLATED HEMOGLOBIN (HGB A1C): Hemoglobin A1C: 6.7

## 2015-06-13 NOTE — Progress Notes (Signed)
Patient of Dr. Hyman HopesJegede not seen in over year Follow up on left ankle abscess He reports changing dressing bid ETOH 48 beers per week Cigarettes 0.5 ppd

## 2015-06-13 NOTE — Progress Notes (Signed)
CC: ED follow-up.  HPI: Scott Quinn is a 54 y.o. male with a previous history of DVT (off anticoagulation) tobacco abuse, alcohol abuse status post incision and drainage of left ankle abscess 2 weeks ago who comes in today for follow-up visit. He reports no discharge from the abscess and has been dressing it and soaking it in warm water.  He does have a history of consumption of significant amount of alcohol-48 beers per week and also small about 1-1/2 packs of cigarettes per day. I have discussed alcohol anonymous and seeking help and he is not interested in any of this at this time . Patient has No headache, No chest pain, No abdominal pain - No Nausea, No new weakness tingling or numbness, No Cough - SOB.  Allergies  Allergen Reactions  . Thimerosal Other (See Comments)    Redness    Past Medical History  Diagnosis Date  . Costochondritis    Current Outpatient Prescriptions on File Prior to Visit  Medication Sig Dispense Refill  . HYDROcodone-acetaminophen (NORCO/VICODIN) 5-325 MG tablet Take 1-2 tablets by mouth every 6 (six) hours as needed. 20 tablet 0   No current facility-administered medications on file prior to visit.   History reviewed. No pertinent family history. Social History   Social History  . Marital Status: Single    Spouse Name: N/A  . Number of Children: N/A  . Years of Education: N/A   Occupational History  . Not on file.   Social History Main Topics  . Smoking status: Current Every Day Smoker  . Smokeless tobacco: Not on file  . Alcohol Use: 28.8 oz/week    48 Cans of beer per week  . Drug Use: No  . Sexual Activity: Yes   Other Topics Concern  . Not on file   Social History Narrative    Review of Systems: Constitutional: Negative for fever, chills, diaphoresis, activity change, appetite change and fatigue. HENT: Negative for ear pain, nosebleeds, congestion, facial swelling, rhinorrhea, neck pain, neck stiffness and ear discharge.    Eyes: Negative for pain, discharge, redness, itching and visual disturbance. Respiratory: Negative for cough, choking, chest tightness, shortness of breath, wheezing and stridor.  Cardiovascular: Negative for chest pain, palpitations and leg swelling. Gastrointestinal: Negative for abdominal distention. Genitourinary: Negative for dysuria, urgency, frequency, hematuria, flank pain, decreased urine volume, difficulty urinating and dyspareunia.  Musculoskeletal: Negative for back pain, joint swelling, arthralgias and gait problem. Neurological: Negative for dizziness, tremors, seizures, syncope, facial asymmetry, speech difficulty, weakness, light-headedness, numbness and headaches.  Hematological: Negative for adenopathy. Does not bruise/bleed easily. Psychiatric/Behavioral: Negative for hallucinations, behavioral problems, confusion, dysphoric mood, decreased concentration and agitation.    Objective:   Filed Vitals:   06/13/15 1045  BP: 130/87  Pulse: 105  Temp: 98.2 F (36.8 C)  Resp: 13    Physical Exam: Constitutional: Patient appears well-developed and well-nourished. No distress. HENT: Normocephalic, atraumatic, External right and left ear normal. Oropharynx is clear and moist.  Eyes: Conjunctivae and EOM are normal. PERRLA, no scleral icterus. Neck: Normal ROM. Neck supple. No JVD. No tracheal deviation. No thyromegaly. CVS: RRR, S1/S2 +, no murmurs, no gallops, no carotid bruit.  Pulmonary: Effort and breath sounds normal, no stridor, rhonchi, wheezes, rales.  Abdominal: Soft. BS +,  no distension, tenderness, rebound or guarding.  Musculoskeletal: Normal range of motion. No edema and no tenderness.  Lymphadenopathy: No lymphadenopathy noted, cervical, inguinal or axillary Neuro: Alert. Normal reflexes, muscle tone coordination. No cranial nerve deficit. Skin:  Left ankle abscess superior to lateral malleolus with surrounding erythema and desquamation of skin. Central  punctum with no discharge noted, mild tenderness appreciated. Marland Kitchen. Psychiatric: Normal mood and affect. Behavior, judgment, thought content normal.  Lab Results  Component Value Date   WBC 8.2 05/29/2015   HGB 18.4* 05/29/2015   HCT 51.8 05/29/2015   MCV 94.5 05/29/2015   PLT 176 05/29/2015   Lab Results  Component Value Date   CREATININE 0.81 05/29/2015   BUN 8 05/29/2015   NA 139 05/29/2015   K 4.1 05/29/2015   CL 100* 05/29/2015   CO2 27 05/29/2015    Lab Results  Component Value Date   HGBA1C 5.3 03/03/2014   Lab Results  Component Value Date   HGBA1C 6.70 06/13/2015    Lipid Panel     Component Value Date/Time   CHOL 175 03/03/2014 1534   TRIG 198* 03/03/2014 1534   HDL 49 03/03/2014 1534   CHOLHDL 3.6 03/03/2014 1534   VLDL 40 03/03/2014 1534   LDLCALC 86 03/03/2014 1534       Assessment and plan:  Left ankle Abscess: Healing process is slow likely due to the fact that his feet are usually in the dependent position from his prolonged driving for work. Dressing change performed today. We will send off A1c to screen for diabetes mellitus. We'll reassess abscess for improvement at next visit.  Type 2 diabetes mellitus: Newly diagnosed with A1c of 6.7. Will see back in 2 weeks at which time the management will be discussed in depth and he will receive testing supplies because at this time he is currently overwhelmed with a new diagnosis.   Alcohol abuse: Counseling on alcohol cessation. Not impressive increased in at this time.  Tobacco abuse: Spent 3 minutes discussing cessation and he is thinking of quitting but is not ready yet.       Jaclyn ShaggyEnobong, Amao, MD. [facility] [phone] 06/13/2015, 11:10 AM

## 2015-06-13 NOTE — Patient Instructions (Signed)
Diabetes Mellitus and Food It is important for you to manage your blood sugar (glucose) level. Your blood glucose level can be greatly affected by what you eat. Eating healthier foods in the appropriate amounts throughout the day at about the same time each day will help you control your blood glucose level. It can also help slow or prevent worsening of your diabetes mellitus. Healthy eating may even help you improve the level of your blood pressure and reach or maintain a healthy weight.  General recommendations for healthful eating and cooking habits include:  Eating meals and snacks regularly. Avoid going long periods of time without eating to lose weight.  Eating a diet that consists mainly of plant-based foods, such as fruits, vegetables, nuts, legumes, and whole grains.  Using low-heat cooking methods, such as baking, instead of high-heat cooking methods, such as deep frying. Work with your dietitian to make sure you understand how to use the Nutrition Facts information on food labels. HOW CAN FOOD AFFECT ME? Carbohydrates Carbohydrates affect your blood glucose level more than any other type of food. Your dietitian will help you determine how many carbohydrates to eat at each meal and teach you how to count carbohydrates. Counting carbohydrates is important to keep your blood glucose at a healthy level, especially if you are using insulin or taking certain medicines for diabetes mellitus. Alcohol Alcohol can cause sudden decreases in blood glucose (hypoglycemia), especially if you use insulin or take certain medicines for diabetes mellitus. Hypoglycemia can be a life-threatening condition. Symptoms of hypoglycemia (sleepiness, dizziness, and disorientation) are similar to symptoms of having too much alcohol.  If your health care provider has given you approval to drink alcohol, do so in moderation and use the following guidelines:  Women should not have more than one drink per day, and men  should not have more than two drinks per day. One drink is equal to:  12 oz of beer.  5 oz of wine.  1 oz of hard liquor.  Do not drink on an empty stomach.  Keep yourself hydrated. Have water, diet soda, or unsweetened iced tea.  Regular soda, juice, and other mixers might contain a lot of carbohydrates and should be counted. WHAT FOODS ARE NOT RECOMMENDED? As you make food choices, it is important to remember that all foods are not the same. Some foods have fewer nutrients per serving than other foods, even though they might have the same number of calories or carbohydrates. It is difficult to get your body what it needs when you eat foods with fewer nutrients. Examples of foods that you should avoid that are high in calories and carbohydrates but low in nutrients include:  Trans fats (most processed foods list trans fats on the Nutrition Facts label).  Regular soda.  Juice.  Candy.  Sweets, such as cake, pie, doughnuts, and cookies.  Fried foods. WHAT FOODS CAN I EAT? Eat nutrient-rich foods, which will nourish your body and keep you healthy. The food you should eat also will depend on several factors, including:  The calories you need.  The medicines you take.  Your weight.  Your blood glucose level.  Your blood pressure level.  Your cholesterol level. You should eat a variety of foods, including:  Protein.  Lean cuts of meat.  Proteins low in saturated fats, such as fish, egg whites, and beans. Avoid processed meats.  Fruits and vegetables.  Fruits and vegetables that may help control blood glucose levels, such as apples, mangoes, and   yams.  Dairy products.  Choose fat-free or low-fat dairy products, such as milk, yogurt, and cheese.  Grains, bread, pasta, and rice.  Choose whole grain products, such as multigrain bread, whole oats, and brown rice. These foods may help control blood pressure.  Fats.  Foods containing healthful fats, such as nuts,  avocado, olive oil, canola oil, and fish. DOES EVERYONE WITH DIABETES MELLITUS HAVE THE SAME MEAL PLAN? Because every person with diabetes mellitus is different, there is not one meal plan that works for everyone. It is very important that you meet with a dietitian who will help you create a meal plan that is just right for you.   This information is not intended to replace advice given to you by your health care provider. Make sure you discuss any questions you have with your health care provider.   Document Released: 03/28/2005 Document Revised: 07/22/2014 Document Reviewed: 05/28/2013 Elsevier Interactive Patient Education 2016 Elsevier Inc.  

## 2015-07-03 ENCOUNTER — Encounter: Payer: Self-pay | Admitting: Family Medicine

## 2015-07-03 ENCOUNTER — Ambulatory Visit: Payer: Commercial Managed Care - HMO | Attending: Family Medicine | Admitting: Family Medicine

## 2015-07-03 VITALS — BP 123/82 | HR 100 | Temp 98.4°F | Resp 12 | Ht 72.0 in | Wt 246.4 lb

## 2015-07-03 DIAGNOSIS — F17210 Nicotine dependence, cigarettes, uncomplicated: Secondary | ICD-10-CM | POA: Insufficient documentation

## 2015-07-03 DIAGNOSIS — Z720 Tobacco use: Secondary | ICD-10-CM | POA: Insufficient documentation

## 2015-07-03 DIAGNOSIS — L03116 Cellulitis of left lower limb: Secondary | ICD-10-CM | POA: Insufficient documentation

## 2015-07-03 DIAGNOSIS — M25562 Pain in left knee: Secondary | ICD-10-CM | POA: Diagnosis not present

## 2015-07-03 DIAGNOSIS — I8393 Asymptomatic varicose veins of bilateral lower extremities: Secondary | ICD-10-CM | POA: Diagnosis not present

## 2015-07-03 DIAGNOSIS — E119 Type 2 diabetes mellitus without complications: Secondary | ICD-10-CM | POA: Insufficient documentation

## 2015-07-03 DIAGNOSIS — Z888 Allergy status to other drugs, medicaments and biological substances status: Secondary | ICD-10-CM | POA: Insufficient documentation

## 2015-07-03 DIAGNOSIS — M79605 Pain in left leg: Secondary | ICD-10-CM | POA: Insufficient documentation

## 2015-07-03 DIAGNOSIS — Z23 Encounter for immunization: Secondary | ICD-10-CM

## 2015-07-03 DIAGNOSIS — Z86718 Personal history of other venous thrombosis and embolism: Secondary | ICD-10-CM | POA: Insufficient documentation

## 2015-07-03 DIAGNOSIS — M940 Chondrocostal junction syndrome [Tietze]: Secondary | ICD-10-CM | POA: Insufficient documentation

## 2015-07-03 MED ORDER — GLUCOSE BLOOD VI STRP: ORAL_STRIP | Status: DC

## 2015-07-03 MED ORDER — TRUEPLUS LANCETS 28G MISC: 1.0000 | Freq: Three times a day (TID) | Status: DC

## 2015-07-03 MED ORDER — LISINOPRIL 2.5 MG PO TABS: 2.5000 mg | ORAL_TABLET | Freq: Every day | ORAL | Status: DC

## 2015-07-03 MED ORDER — TRUE METRIX METER DEVI: 1.0000 | Freq: Three times a day (TID) | Status: DC

## 2015-07-03 NOTE — Progress Notes (Signed)
Subjective:    Patient ID: Scott GuthrieJoseph T Quinn, male    DOB: September 02, 1960, 54 y.o.   MRN: [id number]  HPI 54 year old male with a previous history of DVT (completed his course of anticoagulation) tobacco abuse, alcohol abuse status post incision and drainage of left ankle abscess a month ago who comes in today for follow-up visit. He reports no discharge from the abscess and has been dressing it with a Band-Aid.  He was diagnosed with type 2 diabetes mellitus at his office visit with an A1c of 6.7 and is currently on dietary control. Today he complains of pain in his left leg located in the shin but denies any calf pain. At this moment he says tenderness minimal but is worse when he walks and he does not have to take any analgesics for the pain. 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  Past Medical History  Diagnosis Date  . Costochondritis    Past Surgical History  Procedure Laterality Date  . Joint replacement      knee surgery  . Back surgery      Social History   Social History  . Marital Status: Single    Spouse Name: N/A  . Number of Children: N/A  . Years of Education: N/A   Occupational History  . Not on file.   Social History Main Topics  . Smoking status: Current Every Day Smoker -- 0.25 packs/day  . Smokeless tobacco: Not on file  . Alcohol Use: 1.8 oz/week    3 Cans of beer per week     Comment: reported on 07/03/15  . Drug Use: No  . Sexual Activity: Yes   Other Topics Concern  . Not on file   Social History Narrative    Allergies  Allergen Reactions  . Thimerosal Other (See Comments)    Redness     No current outpatient prescriptions on file prior to visit.   No current facility-administered medications on file prior to visit.       Review of Systems Constitutional: Negative for fever, chills, diaphoresis, activity change, appetite change and fatigue. HENT: Negative for ear pain, nosebleeds,  congestion, facial swelling, rhinorrhea, neck pain, neck stiffness and ear discharge.  Eyes: Negative for pain, discharge, redness, itching and visual disturbance. Respiratory: Negative for cough, choking, chest tightness, shortness of breath, wheezing and stridor.  Cardiovascular: Negative for chest pain, palpitations and leg swelling. Gastrointestinal: Negative for abdominal distention. Genitourinary: Negative for dysuria, urgency, frequency, hematuria, flank pain, decreased urine volume, difficulty urinating and dyspareunia.  Musculoskeletal: See history of present illness Neurological: Negative for dizziness, tremors, seizures, syncope, facial asymmetry, speech difficulty, weakness, light-headedness, numbness and headaches.  Hematological: Negative for adenopathy. Does not bruise/bleed easily. Psychiatric/Behavioral: Negative for hallucinations, behavioral problems, confusion, dysphoric mood, decreased concentration and agitation.      Objective: Filed Vitals:   07/03/15 1113  BP: 123/82  Pulse: 100  Temp: 98.4 F (36.9 C)  Resp: 12  Height: 6' (1.829 m)  Weight: 246 lb 6.4 oz (111.766 kg)  SpO2: 95%      Physical Exam Constitutional: Patient appears well-developed and well-nourished. No distress. Neck: Normal ROM. Neck supple. No JVD. No tracheal deviation. No thyromegaly. CVS: RRR, S1/S2 +, no murmurs, no gallops, no carotid bruit.  Pulmonary: Effort and breath sounds normal, no stridor, rhonchi, wheezes, rales.  Abdominal: Soft. BS +,  no distension, tenderness, rebound or guarding.  Musculoskeletal: Normal range of  motion. No edema and no tenderness.  Lymphadenopathy: No lymphadenopathy noted, cervical, inguinal or axillary Neuro: Alert. Normal reflexes, muscle tone coordination. No cranial nerve deficit. Skin: Left ankle abscess superior to lateral malleolus with minimal erythema and residual ulcer which has improved compared to last visit. Varicose veins in bilateral lower  extremity.no tenderness appreciated. Marland Kitchen Psychiatric: Normal mood and affect. Behavior, judgment, thought content normal.       Assessment & Plan:  Left ankle Abscess: Healing  Advised to use Band-Aid as he still has a residual ulcer   Type 2 diabetes mellitus: Newly diagnosed with A1c of 6.7; diet controlled at this time. Prescription for testing supplies written. Foot exam performed today. microalbumin sent off, Pneumovax given, advised to schedule an annual eye exam with an optometrist or ophthalmologist, commenced on low dose ACE inhibitor for renal protection. Emphasized importance of lifestyle management. Scheduled to see the clinical pharmacist for diabetic teaching. Follow-up with primary care physician at next visit to discuss blood sugar logs.  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 This could also be responsible for his leg pain and has been advised to use compression stockings and elevate his feet when he can. Advised to use Advil or NSAIDs for his left leg pain  Alcohol abuse: Counseling on alcohol cessation. Working on cessation.  Tobacco abuse: Spent 3 minutes discussing cessation and he is thinking of quitting but is not ready yet.

## 2015-07-03 NOTE — Progress Notes (Signed)
Patient here to follow up on his cellulitis  He states it is better He reports left knee pain that radiates to the foot and started 1 week ago and is exacerbated by walking He also reports having 6 beers since the last visit (he reported 48 beers per week last visit)

## 2015-07-04 ENCOUNTER — Telehealth: Payer: Self-pay

## 2015-07-04 LAB — COMPREHENSIVE METABOLIC PANEL
ALT: 25 U/L (ref 9–46)
AST: 35 U/L (ref 10–35)
Albumin: 4.1 g/dL (ref 3.6–5.1)
Alkaline Phosphatase: 50 U/L (ref 40–115)
BUN: 11 mg/dL (ref 7–25)
CHLORIDE: 101 mmol/L (ref 98–110)
CO2: 28 mmol/L (ref 20–31)
CREATININE: 0.76 mg/dL (ref 0.70–1.33)
Calcium: 9.3 mg/dL (ref 8.6–10.3)
Glucose, Bld: 95 mg/dL (ref 65–99)
Potassium: 4.4 mmol/L (ref 3.5–5.3)
SODIUM: 141 mmol/L (ref 135–146)
Total Bilirubin: 0.8 mg/dL (ref 0.2–1.2)
Total Protein: 6.9 g/dL (ref 6.1–8.1)

## 2015-07-04 LAB — LIPID PANEL
CHOL/HDL RATIO: 4.6 ratio (ref <=5.0)
Cholesterol: 171 mg/dL (ref 125–200)
HDL: 37 mg/dL — ABNORMAL LOW (ref >=40)
LDL CALC: 116 mg/dL (ref <130)
TRIGLYCERIDES: 91 mg/dL (ref <150)
VLDL: 18 mg/dL (ref <30)

## 2015-07-04 LAB — MICROALBUMIN, URINE: MICROALB UR: 2.9 mg/dL

## 2015-07-04 NOTE — Telephone Encounter (Signed)
Pt. Returned call. Please f/u with pt. °

## 2015-07-04 NOTE — Telephone Encounter (Signed)
CMA called patient, patient did not answer. Patient was left a message to return my call.

## 2015-07-04 NOTE — Telephone Encounter (Signed)
-----   Message from Jaclyn ShaggyEnobong Amao, MD sent at 07/04/2015  9:01 AM EST ----- Please inform the patient that labs are normal. Thank you.

## 2015-07-04 NOTE — Telephone Encounter (Signed)
CMA called patient, patient verified name and DOB. Patient was given lab results, asked about his labs and was told that per Dr. Venetia NightAmao everything is normal. Patient verbalized that he understood with no further questions. Patient has an appt with Stacy on 12/21 and requesting printout of lab results.

## 2015-07-05 ENCOUNTER — Telehealth: Payer: Self-pay

## 2015-07-05 ENCOUNTER — Ambulatory Visit: Payer: Commercial Managed Care - HMO | Attending: Internal Medicine | Admitting: Pharmacist

## 2015-07-05 DIAGNOSIS — E119 Type 2 diabetes mellitus without complications: Secondary | ICD-10-CM | POA: Insufficient documentation

## 2015-07-05 NOTE — Patient Instructions (Signed)
Thanks for coming to see me!  Go pick up the meter and the lisinopril from the pharmacy  Follow up with Dr. Hyman Hopes   Blood Glucose Monitoring, Adult Monitoring your blood glucose (also know as blood sugar) helps you to manage your diabetes. It also helps you and your health care provider monitor your diabetes and determine how well your treatment plan is working. WHY SHOULD YOU MONITOR YOUR BLOOD GLUCOSE?  It can help you understand how food, exercise, and medicine affect your blood glucose.  It allows you to know what your blood glucose is at any given moment. You can quickly tell if you are having low blood glucose (hypoglycemia) or high blood glucose (hyperglycemia).  It can help you and your health care provider know how to adjust your medicines.  It can help you understand how to manage an illness or adjust medicine for exercise. WHEN SHOULD YOU TEST? Your health care provider will help you decide how often you should check your blood glucose. This may depend on the type of diabetes you have, your diabetes control, or the types of medicines you are taking. Be sure to write down all of your blood glucose readings so that this information can be reviewed with your health care provider. See below for examples of testing times that your health care provider may suggest. Type 1 Diabetes  Test at least 2 times per day if your diabetes is well controlled, if you are using an insulin pump, or if you perform multiple daily injections.  If your diabetes is not well controlled or if you are sick, you may need to test more often.  It is a good idea to also test:  Before every insulin injection.  Before and after exercise.  Between meals and 2 hours after a meal.  Occasionally between 2:00 a.m. and 3:00 a.m. Type 2 Diabetes  If you are taking insulin, test at least 2 times per day. However, it is best to test before every insulin injection.  If you take medicines by mouth (orally),  test 2 times a day.  If you are on a controlled diet, test once a day.  If your diabetes is not well controlled or if you are sick, you may need to monitor more often. HOW TO MONITOR YOUR BLOOD GLUCOSE Supplies Needed  Blood glucose meter.  Test strips for your meter. Each meter has its own strips. You must use the strips that go with your own meter.  A pricking needle (lancet).  A device that holds the lancet (lancing device).  A journal or log book to write down your results. Procedure  Wash your hands with soap and water. Alcohol is not preferred.  Prick the side of your finger (not the tip) with the lancet.  Gently milk the finger until a small drop of blood appears.  Follow the instructions that come with your meter for inserting the test strip, applying blood to the strip, and using your blood glucose meter. Other Areas to Get Blood for Testing Some meters allow you to use other areas of your body (other than your finger) to test your blood. These areas are called alternative sites. The most common alternative sites are:  The forearm.  The thigh.  The back area of the lower leg.  The palm of the hand. The blood flow in these areas is slower. Therefore, the blood glucose values you get may be delayed, and the numbers are different from what you would get from your  fingers. Do not use alternative sites if you think you are having hypoglycemia. Your reading will not be accurate. Always use a finger if you are having hypoglycemia. Also, if you cannot feel your lows (hypoglycemia unawareness), always use your fingers for your blood glucose checks. ADDITIONAL TIPS FOR GLUCOSE MONITORING  Do not reuse lancets.  Always carry your supplies with you.  All blood glucose meters have a 24-hour "hotline" number to call if you have questions or need help.  Adjust (calibrate) your blood glucose meter with a control solution after finishing a few boxes of strips. BLOOD GLUCOSE  RECORD KEEPING It is a good idea to keep a daily record or log of your blood glucose readings. Most glucose meters, if not all, keep your glucose records stored in the meter. Some meters come with the ability to download your records to your home computer. Keeping a record of your blood glucose readings is especially helpful if you are wanting to look for patterns. Make notes to go along with the blood glucose readings because you might forget what happened at that exact time. Keeping good records helps you and your health care provider to work together to achieve good diabetes management.    This information is not intended to replace advice given to you by your health care provider. Make sure you discuss any questions you have with your health care provider.   Document Released: 07/04/2003 Document Revised: 07/22/2014 Document Reviewed: 11/23/2012 Elsevier Interactive Patient Education 2016 Elsevier Inc. Basic Carbohydrate Counting for Diabetes Mellitus Carbohydrate counting is a method for keeping track of the amount of carbohydrates you eat. Eating carbohydrates naturally increases the level of sugar (glucose) in your blood, so it is important for you to know the amount that is okay for you to have in every meal. Carbohydrate counting helps keep the level of glucose in your blood within normal limits. The amount of carbohydrates allowed is different for every person. A dietitian can help you calculate the amount that is right for you. Once you know the amount of carbohydrates you can have, you can count the carbohydrates in the foods you want to eat. Carbohydrates are found in the following foods:  Grains, such as breads and cereals.  Dried beans and soy products.  Starchy vegetables, such as potatoes, peas, and corn.  Fruit and fruit juices.  Milk and yogurt.  Sweets and snack foods, such as cake, cookies, candy, chips, soft drinks, and fruit drinks. CARBOHYDRATE COUNTING There are two  ways to count the carbohydrates in your food. You can use either of the methods or a combination of both. Reading the "Nutrition Facts" on Packaged Food The "Nutrition Facts" is an area that is included on the labels of almost all packaged food and beverages in the [place]. It includes the serving size of that food or beverage and information about the nutrients in each serving of the food, including the grams (g) of carbohydrate per serving.  Decide the number of servings of this food or beverage that you will be able to eat or drink. Multiply that number of servings by the number of grams of carbohydrate that is listed on the label for that serving. The total will be the amount of carbohydrates you will be having when you eat or drink this food or beverage. Learning Standard Serving Sizes of Food When you eat food that is not packaged or does not include "Nutrition Facts" on the label, you need to measure the servings in  order to count the amount of carbohydrates.A serving of most carbohydrate-rich foods contains about 15 g of carbohydrates. The following list includes serving sizes of carbohydrate-rich foods that provide 15 g ofcarbohydrate per serving:   1 slice of bread (1 oz) or 1 six-inch tortilla.    of a hamburger bun or English muffin.  4-6 crackers.   cup unsweetened dry cereal.    cup hot cereal.   cup rice or pasta.    cup mashed potatoes or  of a large baked potato.  1 cup fresh fruit or one small piece of fruit.    cup canned or frozen fruit or fruit juice.  1 cup milk.   cup plain fat-free yogurt or yogurt sweetened with artificial sweeteners.   cup cooked dried beans or starchy vegetable, such as peas, corn, or potatoes.  Decide the number of standard-size servings that you will eat. Multiply that number of servings by 15 (the grams of carbohydrates in that serving). For example, if you eat 2 cups of strawberries, you will have eaten 2 servings and  30 g of carbohydrates (2 servings x 15 g = 30 g). For foods such as soups and casseroles, in which more than one food is mixed in, you will need to count the carbohydrates in each food that is included. EXAMPLE OF CARBOHYDRATE COUNTING Sample Dinner  3 oz chicken breast.   cup of brown rice.   cup of corn.  1 cup milk.   1 cup strawberries with sugar-free whipped topping.  Carbohydrate Calculation Step 1: Identify the foods that contain carbohydrates:   Rice.   Corn.   Milk.   Strawberries. Step 2:Calculate the number of servings eaten of each:   2 servings of rice.   1 serving of corn.   1 serving of milk.   1 serving of strawberries. Step 3: Multiply each of those number of servings by 15 g:   2 servings of rice x 15 g = 30 g.   1 serving of corn x 15 g = 15 g.   1 serving of milk x 15 g = 15 g.   1 serving of strawberries x 15 g = 15 g. Step 4: Add together all of the amounts to find the total grams of carbohydrates eaten: 30 g + 15 g + 15 g + 15 g = 75 g.   This information is not intended to replace advice given to you by your health care provider. Make sure you discuss any questions you have with your health care provider.   Document Released: 07/01/2005 Document Revised: 07/22/2014 Document Reviewed: 05/28/2013 Elsevier Interactive Patient Education [employer]2016 Elsevier Inc.

## 2015-07-05 NOTE — Telephone Encounter (Signed)
-----   Message from Enobong Amao, MD sent at 07/04/2015  9:01 AM EST ----- Please inform the patient that labs are normal. Thank you. 

## 2015-07-05 NOTE — Telephone Encounter (Signed)
Error. Patient was given lab results on 12/20.

## 2015-07-05 NOTE — Progress Notes (Signed)
S:    Patient arrives in good spirits.  Presents for diabetes education. Patient reports Diabetes was newly diagnosed.   Patient is not currently on any diabetes medications.  Patient reported dietary habits: he travels a lot for work and so it makes it difficult to follow a diet. He often has to eat out. Patient also reports that he drinks almost a gallon of sweet tea a day. Patient is concerned about alcohol use with diabetes.   Patient reported exercise habits: none   Patient reports nocturia.  Patient denies neuropathy. Patient denies visual changes. Patient denies self foot exams.   Patient reports that his left ankle abscess and pain are continuing to resolve and have not worsened since seeing Dr. Venetia NightAmao.     O:  Lab Results  Component Value Date   HGBA1C 6.70 06/13/2015    A/P: Diabetes newly diagnosed currently controlled based on A1c of <7.   Patient denies hypoglycemic events and is able to verbalize appropriate hypoglycemia management plan.  Control is suboptimal due to dietary indiscretion and sedentary lifestyle.  Educated patient on lifestyle modification, including the plate method, basic carb counting, and exercise. Also instructed patient to use artificial sweetener for tea if he continues to drink it. Also reviewed diabetes, the complications of diabetes, and what the A1c is, as well as blood glucose monitoring. Instructed patient to avoid alcohol if possible but if not to limit intake to <2 alcohol beverage servings a day - I know that patient has a history of alcohol abuse so he may not be able to do this a this time. Provided education on lisinopril, including what it was for, how to take, and possible adverse effects. Patient verbalized understanding.  Next A1C anticipated March 2017.    Written patient instructions provided.  Total time in face to face counseling 20 minutes.  Follow up in Pharmacist Clinic Visit as needed.

## 2015-07-11 ENCOUNTER — Telehealth: Payer: Self-pay | Admitting: Internal Medicine

## 2015-07-11 NOTE — Telephone Encounter (Signed)
Patient is experiencing numbness and tingling.Marland Kitchen.Marland Kitchen.Marland Kitchen.Marland Kitchen.please call patient and advised

## 2015-07-11 NOTE — Telephone Encounter (Signed)
Patient called reporting tingling and numbness in his leg.  He denies s/sx of headache, visual changes, slurred speech, chest pain, weakness, or tingling/numbness anywhere else. He also denies leg swelling, pain, or redness (patient has history of DVT). He denies any other symptoms or changes. Last BMET looks good from this month.  He reports that it is in his foot of the leg that has cellulitis. However, he reports that the cellulitis is improving.   He also reports that he just lost his father and was away at a funeral.  Transferred patient to the front to schedule an appointment for follow up. There is nothing available for a few weeks so patient was transferred back to me. Encouraged patient to consider going to Urgent Care if symptoms continue or worsen. Patient verbalized understanding.   Juanita CraverStacey Karl, PharmD, BCPS, CPP Clinical Pharmacist Practitioner  [facility] [phone]

## 2016-07-18 ENCOUNTER — Inpatient Hospital Stay (HOSPITAL_COMMUNITY)
Admission: EM | Admit: 2016-07-18 | Discharge: 2016-07-20 | DRG: 272 | Disposition: A | Discharge status: Home / Self Care | Payer: BLUE CROSS/BLUE SHIELD | Attending: Vascular Surgery | Admitting: Vascular Surgery

## 2016-07-18 ENCOUNTER — Emergency Department (HOSPITAL_BASED_OUTPATIENT_CLINIC_OR_DEPARTMENT_OTHER)
Admit: 2016-07-18 | Discharge: 2016-07-18 | Disposition: A | Discharge status: Home / Self Care | Payer: BLUE CROSS/BLUE SHIELD | Attending: Emergency Medicine | Admitting: Emergency Medicine

## 2016-07-18 ENCOUNTER — Other Ambulatory Visit: Payer: Self-pay

## 2016-07-18 ENCOUNTER — Encounter (HOSPITAL_COMMUNITY): Payer: Self-pay | Admitting: Emergency Medicine

## 2016-07-18 DIAGNOSIS — I82432 Acute embolism and thrombosis of left popliteal vein: Principal | ICD-10-CM | POA: Diagnosis present

## 2016-07-18 DIAGNOSIS — E119 Type 2 diabetes mellitus without complications: Secondary | ICD-10-CM | POA: Diagnosis present

## 2016-07-18 DIAGNOSIS — I82402 Acute embolism and thrombosis of unspecified deep veins of left lower extremity: Secondary | ICD-10-CM

## 2016-07-18 DIAGNOSIS — M7989 Other specified soft tissue disorders: Secondary | ICD-10-CM | POA: Diagnosis present

## 2016-07-18 DIAGNOSIS — F17200 Nicotine dependence, unspecified, uncomplicated: Secondary | ICD-10-CM | POA: Diagnosis present

## 2016-07-18 DIAGNOSIS — Z86718 Personal history of other venous thrombosis and embolism: Secondary | ICD-10-CM | POA: Diagnosis not present

## 2016-07-18 DIAGNOSIS — I82409 Acute embolism and thrombosis of unspecified deep veins of unspecified lower extremity: Secondary | ICD-10-CM | POA: Diagnosis present

## 2016-07-18 DIAGNOSIS — I82412 Acute embolism and thrombosis of left femoral vein: Secondary | ICD-10-CM | POA: Diagnosis present

## 2016-07-18 DIAGNOSIS — Z79899 Other long term (current) drug therapy: Secondary | ICD-10-CM

## 2016-07-18 HISTORY — DX: Acute embolism and thrombosis of unspecified deep veins of unspecified lower extremity: I82.409

## 2016-07-18 HISTORY — DX: Type 2 diabetes mellitus without complications: E11.9

## 2016-07-18 LAB — BASIC METABOLIC PANEL
ANION GAP: 12 (ref 5–15)
BUN: 11 mg/dL (ref 6–20)
CALCIUM: 9.5 mg/dL (ref 8.9–10.3)
CO2: 28 mmol/L (ref 22–32)
CREATININE: 0.91 mg/dL (ref 0.61–1.24)
Chloride: 99 mmol/L — ABNORMAL LOW (ref 101–111)
GFR calc Af Amer: >60 mL/min (ref >60)
GLUCOSE: 111 mg/dL — AB (ref 65–99)
Potassium: 3.8 mmol/L (ref 3.5–5.1)
Sodium: 139 mmol/L (ref 135–145)

## 2016-07-18 LAB — PROTIME-INR
INR: 1.02
Prothrombin Time: 13.4 seconds (ref 11.4–15.2)

## 2016-07-18 LAB — CBC WITH DIFFERENTIAL/PLATELET
BASOS ABS: 0 10*3/uL (ref 0.0–0.1)
Basophils Relative: 1 %
EOS PCT: 2 %
Eosinophils Absolute: 0.1 10*3/uL (ref 0.0–0.7)
HCT: 52.2 % — ABNORMAL HIGH (ref 39.0–52.0)
Hemoglobin: 19 g/dL — ABNORMAL HIGH (ref 13.0–17.0)
LYMPHS PCT: 25 %
Lymphs Abs: 2 10*3/uL (ref 0.7–4.0)
MCH: 33.3 pg (ref 26.0–34.0)
MCHC: 36.4 g/dL — AB (ref 30.0–36.0)
MCV: 91.6 fL (ref 78.0–100.0)
MONOS PCT: 14 %
Monocytes Absolute: 1.2 10*3/uL — ABNORMAL HIGH (ref 0.1–1.0)
Neutro Abs: 4.8 10*3/uL (ref 1.7–7.7)
Neutrophils Relative %: 58 %
PLATELETS: 128 10*3/uL — AB (ref 150–400)
RBC: 5.7 MIL/uL (ref 4.22–5.81)
RDW: 12.6 % (ref 11.5–15.5)
WBC: 8.2 10*3/uL (ref 4.0–10.5)

## 2016-07-18 LAB — APTT: APTT: 34 s (ref 24–36)

## 2016-07-18 MED ORDER — HEPARIN (PORCINE) IN NACL 100-0.45 UNIT/ML-% IJ SOLN
1800.0000 [IU]/h | INTRAMUSCULAR | Status: DC
  Administered 2016-07-18: 1650 [IU]/h via INTRAVENOUS
  Filled 2016-07-18 (×2): qty 250

## 2016-07-18 MED ORDER — HEPARIN BOLUS VIA INFUSION
5000.0000 [IU] | Freq: Once | INTRAVENOUS | Status: AC
  Administered 2016-07-18: 5000 [IU] via INTRAVENOUS
  Filled 2016-07-18: qty 5000

## 2016-07-18 NOTE — Progress Notes (Signed)
*  PRELIMINARY RESULTS* Vascular Ultrasound Lower extremity venous duplex has been completed.  Preliminary findings: DVT noted in the Left femoral vein, popliteal vein, gastroc veins, posterior tibial veins, and peroneal veins.  No DVT RLE.   Called results to MartinBrooke, RN    Farrel DemarkJill Eunice, RDMS, RVT  07/18/2016, 5:18 PM

## 2016-07-18 NOTE — ED Provider Notes (Signed)
[facility]-EMERGENCY DEPT Provider Note   CSN: [id number] Arrival date & time: 07/18/16  1620     History   Chief Complaint Chief Complaint  Patient presents with  . Leg Pain    HPI COCHISE DINNEEN is a 56 y.o. male.  Patients with history of DVT several years ago, treated with Coumadin, now on no anticoagulation, history of diabetes, history of smoking -- presents with complaint of leg pain, heaviness, swelling starting yesterday. Patient began with aching pain behind his left knee. Symptoms reminded him of previous blood clot. Patient elevated his legs overnight. Upon awakening this morning, symptoms are not improved. Patient denies any chest pain or shortness of breath. 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 The onset of this condition was acute. The course is constant. Aggravating factors: none. Alleviating factors: none.        Past Medical History:  Diagnosis Date  . Costochondritis   . DVT (deep venous thrombosis) [facility])     Patient Active Problem List   Diagnosis Date Noted  . Type 2 diabetes mellitus (HCC) 07/03/2015  . Alcohol abuse 06/13/2015  . Colon cancer screening 03/03/2014  . Encounter for therapeutic drug monitoring 10/28/2013  . DVT (deep venous thrombosis) (HCC) 10/07/2013  . DVT of lower limb, acute (HCC) 09/22/2013  . Tobacco use 09/22/2013    Past Surgical History:  Procedure Laterality Date  . BACK SURGERY    . JOINT REPLACEMENT     knee surgery       Home Medications    Prior to Admission medications   Medication Sig Start Date End Date Taking? Authorizing Provider  Blood Glucose Monitoring Suppl (TRUE METRIX METER) DEVI 1 each by Does not apply route 3 (three) times daily before meals. 07/03/15   Jaclyn Shaggy, MD  glucose blood (TRUE METRIX BLOOD GLUCOSE TEST) test strip Use as instructed 3 times daily 07/03/15   Jaclyn Shaggy, MD  lisinopril (PRINIVIL,ZESTRIL) 2.5 MG tablet Take 1  tablet (2.5 mg total) by mouth daily. 07/03/15   Jaclyn Shaggy, MD  TRUEPLUS LANCETS 28G MISC 1 each by Does not apply route 3 (three) times daily before meals. 07/03/15   Jaclyn Shaggy, MD    Family History History reviewed. No pertinent family history.  Social History Social History  Substance Use Topics  . Smoking status: Current Every Day Smoker    Packs/day: 0.25  . Smokeless tobacco: Never Used  . Alcohol use 1.8 oz/week    3 Cans of beer per week     Comment: reported on 07/03/15     Allergies   Thimerosal   Review of Systems Review of Systems  Constitutional: Negative for fever.  HENT: Negative for rhinorrhea and sore throat.   Eyes: Negative for redness.  Respiratory: Negative for cough.   Cardiovascular: Positive for leg swelling. Negative for chest pain.  Gastrointestinal: Negative for abdominal pain, diarrhea, nausea and vomiting.  Genitourinary: Negative for dysuria.  Musculoskeletal: Positive for myalgias.  Skin: Negative for rash.  Neurological: Negative for headaches.     Physical Exam Updated Vital Signs BP 151/92   Pulse 104   Temp 98.6 F (37 C) (Oral)   Resp 19   Ht 6' (1.829 m)   Wt 99.8 kg   SpO2 97%   BMI 29.84 kg/m   Physical Exam  Constitutional: He appears well-developed and well-nourished.  HENT:  Head: Normocephalic and atraumatic.  Eyes: Conjunctivae are normal. Right eye exhibits  no discharge. Left eye exhibits no discharge.  Neck: Normal range of motion. Neck supple.  Cardiovascular: Normal rate, regular rhythm and normal heart sounds.   Pulmonary/Chest: Effort normal and breath sounds normal.  Abdominal: Soft. There is no tenderness.  Musculoskeletal:  Trace edema left lower extremity, nonpitting. Mild generalized tenderness of LLE. No erythema or redness suggestive of infection. 2+ bilateral dorsalis pedis pulses bilaterally.  Neurological: He is alert.  Skin: Skin is warm and dry.  Psychiatric: He has a normal mood and  affect.  Nursing note and vitals reviewed.    ED Treatments / Results  Labs (all labs ordered are listed, but only abnormal results are displayed) Labs Reviewed  CBC WITH DIFFERENTIAL/PLATELET  BASIC METABOLIC PANEL  PROTIME-INR  APTT  HEPARIN LEVEL (UNFRACTIONATED)  CBC    EKG  EKG Interpretation  Date/Time:  Thursday July 18 2016 20:56:58 EST Ventricular Rate:  104 PR Interval:    QRS Duration: 91 QT Interval:  334 QTC Calculation: 440 R Axis:   88 Text Interpretation:  Sinus tachycardia Probable left atrial enlargement RSR' in V1 or V2, probably normal variant No significant change since last tracing Confirmed by Kandis MannanMACKUEN, COURTNEY ([id number]) on 07/18/2016 9:14:26 PM       Procedures Procedures (including critical care time)  Medications Ordered in ED Medications  heparin ADULT infusion 100 units/mL (25000 units/21850mL sodium chloride 0.45%) (1,650 Units/hr Intravenous New Bag/Given 07/18/16 2115)  heparin bolus via infusion 5,000 Units (5,000 Units Intravenous Bolus from Bag 07/18/16 2121)     Initial Impression / Assessment and Plan / ED Course  I have reviewed the triage vital signs and the nursing notes.  Pertinent labs & imaging results that were available during my care of the patient were reviewed by me and considered in my medical decision making (see chart for details).  Clinical Course    Patient seen and examined. + extensive DVT. Dr. Randie Heinzain has seen patient and will admit. Pt updated and agree with plan.   Vital signs reviewed and are as follows: BP 151/92   Pulse 104   Temp 98.6 F (37 C) (Oral)   Resp 19   Ht 6' (1.829 m)   Wt 99.8 kg   SpO2 97%   BMI 29.84 kg/m    ED ECG REPORT   Date: 07/18/2016  Rate: 104  Rhythm: sinus tachycardia  QRS Axis: normal  Intervals: normal  ST/T Wave abnormalities: normal  Conduction Disutrbances:none  Narrative Interpretation:   Old EKG Reviewed: unchanged from 2014  I have personally reviewed the EKG  tracing and agree with the computerized printout as noted.   Final Clinical Impressions(s) / ED Diagnoses   Final diagnoses:  Acute deep vein thrombosis (DVT) of left lower extremity, unspecified vein (HCC)   Patient with extensive left lower extremity DVT requiring hospitalization for IV anticoagulation and potential vascular intervention.  New Prescriptions New Prescriptions   No medications on file     Renne CriglerJoshua Lilia Letterman, PA-C 07/18/16 2127    Courteney Lyn Mackuen, MD 07/23/16 1627

## 2016-07-18 NOTE — H&P (Signed)
ED Consult    Reason for Consult:  DVT Referring Physician:  ED MRN #:  [id number]  History of Present Illness: This is a 56 y.o. male in his left lower extremity that was treated with Coumadin therapy for what he thinks was 6 months and was discontinued after negative study. He now returns with a one-day history of left lower extremity swelling and pain and is concern for new DVT. He does not have any recent trauma to the left leg. 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 He has recently traveled but it was several weeks ago and he was driving. He is a daily smoker does not take aspirin or other blood thinners at this time. He has no other personal or any family history of DVT but does not know much of his family very well. He does not have a history of cancer but does have a history of a colon polyp but states he is out of date for his colonoscopy. He has no strokes in the past and does not have any bleeding although does have hemorrhoids that occasionally flare. He does not have fevers chills nausea vomiting headaches or other symptoms related to today's visit.  Past Medical History:  Diagnosis Date  . Costochondritis   . DVT (deep venous thrombosis) (HCC)     Past Surgical History:  Procedure Laterality Date  . BACK SURGERY    . JOINT REPLACEMENT     knee surgery    Allergies  Allergen Reactions  . Thimerosal Other (See Comments)    Redness     Prior to Admission medications   Medication Sig Start Date End Date Taking? Authorizing Provider  Blood Glucose Monitoring Suppl (TRUE METRIX METER) DEVI 1 each by Does not apply route 3 (three) times daily before meals. 07/03/15   Jaclyn Shaggy, MD  glucose blood (TRUE METRIX BLOOD GLUCOSE TEST) test strip Use as instructed 3 times daily 07/03/15   Jaclyn Shaggy, MD  lisinopril (PRINIVIL,ZESTRIL) 2.5 MG tablet Take 1 tablet (2.5 mg total) by mouth daily. 07/03/15   Jaclyn Shaggy, MD  TRUEPLUS LANCETS 28G MISC 1 each by Does not apply route 3  (three) times daily before meals. 07/03/15   Jaclyn Shaggy, MD    Social History   Social History  . Marital status: Single    Spouse name: N/A  . Number of children: N/A  . Years of education: N/A   Occupational History  . Not on file.   Social History Main Topics  . Smoking status: Current Every Day Smoker    Packs/day: 0.25  . Smokeless tobacco: Never Used  . Alcohol use 1.8 oz/week    3 Cans of beer per week     Comment: reported on 07/03/15  . Drug use: No  . Sexual activity: Yes   Other Topics Concern  . Not on file   Social History Narrative  . No narrative on file     History reviewed. No pertinent family history.  ROS: [x]  Positive   [ ]  Negative   [ ]  All sytems reviewed and are negative  Cardiovascular: []  chest pain/pressure []  palpitations []  SOB lying flat []  DOE []  pain in legs while walking []  pain in legs at rest []  pain in legs at night []  non-healing ulcers [x]  hx of DVT [x]  swelling in legs  Pulmonary: []  productive cough []  asthma/wheezing []  home O2  Neurologic: []  weakness in []  arms []  legs []  numbness in []  arms []  legs []   hx of CVA []  mini stroke [] difficulty speaking or slurred speech []  temporary loss of vision in one eye []  dizziness  Hematologic: []  hx of cancer []  bleeding problems []  problems with blood clotting easily  Endocrine:   [x]  diabetes []  thyroid disease  GI []  vomiting blood []  blood in stool  GU: []  CKD/renal failure []  HD--[]  M/W/F or []  T/T/S []  burning with urination []  blood in urine  Psychiatric: []  anxiety []  depression  Musculoskeletal: []  arthritis []  joint pain  Integumentary: []  rashes []  ulcers  Constitutional: []  fever []  chills   Physical Examination  Vitals:   07/18/16 2100 07/18/16 2130  BP: 151/92 135/66  Pulse: 104 104  Resp: 19 18  Temp:     Body mass index is 29.84 kg/m.  General:  WDWN in NAD Gait: Not observed HENT: WNL, normocephalic Pulmonary:  normal non-labored breathing, without Rales, rhonchi,  wheezing Cardiac: palpable radial, femoral , pedal pulses bilaterally Abdomen: soft, NT/ND, no masses Skin: chronic venous skin changes bilateral lower legs Extremities: left leg is tight compared to right, no pitting edema Musculoskeletal: no muscle wasting or atrophy  Neurologic: A&O X 3; Appropriate Affect ; SENSATION: normal; MOTOR FUNCTION:  moving all extremities equally. Speech is fluent/normal   CBC    Component Value Date/Time   WBC 8.2 07/18/2016 2103   RBC 5.70 07/18/2016 2103   HGB 19.0 (H) 07/18/2016 2103   HCT 52.2 (H) 07/18/2016 2103   PLT 128 (L) 07/18/2016 2103   MCV 91.6 07/18/2016 2103   MCH 33.3 07/18/2016 2103   MCHC 36.4 (H) 07/18/2016 2103   RDW 12.6 07/18/2016 2103   LYMPHSABS 2.0 07/18/2016 2103   MONOABS 1.2 (H) 07/18/2016 2103   EOSABS 0.1 07/18/2016 2103   BASOSABS 0.0 07/18/2016 2103    BMET    Component Value Date/Time   NA 139 07/18/2016 2103   K 3.8 07/18/2016 2103   CL 99 (L) 07/18/2016 2103   CO2 28 07/18/2016 2103   GLUCOSE 111 (H) 07/18/2016 2103   BUN 11 07/18/2016 2103   CREATININE 0.91 07/18/2016 2103   CREATININE 0.76 07/03/2015 1151   CALCIUM 9.5 07/18/2016 2103   GFRNONAA >60 07/18/2016 2103   GFRNONAA >89 03/03/2014 1534   GFRAA >60 07/18/2016 2103   GFRAA >89 03/03/2014 1534    COAGS: Lab Results  Component Value Date   INR 1.02 07/18/2016   INR 1.1 03/03/2014   INR 3.5 11/29/2013     Non-Invasive Vascular Imaging:   Summary:  - Findings consistent with acute deep vein thrombosis involving the   left femoral vein, left popliteal vein, left posterial tibial   vein, left peroneal vein, and left gastrocnemius vein. - No evidence of deep vein thrombosis involving the right lower   extremity. - No evidence of Baker&'s cyst on the right or left. - Bilateral enlarged inguinal lymph nodes visualized.     ASSESSMENT/PLAN: This is a 56 y.o. 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 As this is a recurrent DVT have talked to him about long-term anticoagulation. Given that it is left-sided and central on his leg I think there is some concern of a more central obstruction such as May Thurners. Also given this extent of DVT he is at significant risk for post-thrombotic syndrome with long-term pain swelling and heaviness of his left leg. For this reason I discussed with him the possibility of venous intervention with possible thrombectomy intravascular ultrasound evaluation of the  central venous system for possible stenting. This time we'll place him on heparin he'll be nothing by mouth at midnight. I'll plan for IVC and iliac vein duplex in the morning and possibly have my partner performed [facility]. Mr. Scott Quinn demonstrates good understanding and is willing to proceed.  Hendrixx Severin C. Randie Heinzain, MD [facility] [place] Office: [phone] Pager: [phone]

## 2016-07-18 NOTE — ED Triage Notes (Signed)
EDP aware of pt's DVT.

## 2016-07-18 NOTE — ED Triage Notes (Signed)
Pt here for left leg pain and swelling; pt sts hx of DVT and feels like may be same

## 2016-07-18 NOTE — Progress Notes (Signed)
ANTICOAGULATION CONSULT NOTE - Initial Consult  Pharmacy Consult for heparin Indication: DVT  Allergies  Allergen Reactions  . Thimerosal Other (See Comments)    Redness     Patient Measurements: Height: 6' (182.9 cm) Weight: 220 lb (99.8 kg) IBW/kg (Calculated) : 77.6 Heparin Dosing Weight: 97kg  Vital Signs: Temp: 98.6 F (37 C) (01/04 1634) Temp Source: Oral (01/04 1634) BP: 161/89 (01/04 1634) Pulse Rate: 116 (01/04 1634)  Labs: No results for input(s): HGB, HCT, PLT, APTT, LABPROT, INR, HEPARINUNFRC, HEPRLOWMOCWT, CREATININE, CKTOTAL, CKMB, TROPONINI in the last 72 hours.  CrCl cannot be calculated (Patient's most recent lab result is older than the maximum 21 days allowed.).   Assessment: [age] YOM who presents with L leg pain and swelling, found to have DVT on doppler. History of DVT ~3 years ago and was on warfarin for treatment at that time (for months to a year per patient), not currently on any anticoagulants. 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 No bleeding noted.   Goal of Therapy:  Heparin level 0.3-0.7 units/ml Monitor platelets by anticoagulation protocol: Yes   Plan:  -baseline CBC, aPTT/INR ordered -heparin bolus with 5000 units IV x1, then start heparin infusion at 1650 units/hr -heparin level in 6h -daily heparin level and CBC -follow for s/s bleeding and long term anticoagulation plans.  Mckynlee Luse D. Carmel Garfield, PharmD, BCPS Clinical Pharmacist Pager: [phone] 07/18/2016 8:53 PM

## 2016-07-19 ENCOUNTER — Ambulatory Visit (HOSPITAL_COMMUNITY): Payer: BLUE CROSS/BLUE SHIELD

## 2016-07-19 ENCOUNTER — Encounter (HOSPITAL_COMMUNITY): Payer: Self-pay | Admitting: General Practice

## 2016-07-19 ENCOUNTER — Encounter (HOSPITAL_COMMUNITY)
Admission: EM | Disposition: A | Discharge status: Home / Self Care | Payer: Self-pay | Source: Home / Self Care | Attending: Vascular Surgery

## 2016-07-19 DIAGNOSIS — Z86718 Personal history of other venous thrombosis and embolism: Secondary | ICD-10-CM

## 2016-07-19 HISTORY — PX: CARDIAC CATHETERIZATION: SHX172

## 2016-07-19 HISTORY — PX: PERIPHERAL VASCULAR CATHETERIZATION: SHX172C

## 2016-07-19 LAB — GLUCOSE, CAPILLARY: GLUCOSE-CAPILLARY: 88 mg/dL (ref 65–99)

## 2016-07-19 LAB — COMPREHENSIVE METABOLIC PANEL
ALT: 20 U/L (ref 17–63)
AST: 26 U/L (ref 15–41)
Albumin: 3.4 g/dL — ABNORMAL LOW (ref 3.5–5.0)
Alkaline Phosphatase: 61 U/L (ref 38–126)
Anion gap: 6 (ref 5–15)
BILIRUBIN TOTAL: 0.9 mg/dL (ref 0.3–1.2)
BUN: 10 mg/dL (ref 6–20)
CHLORIDE: 104 mmol/L (ref 101–111)
CO2: 31 mmol/L (ref 22–32)
Calcium: 9 mg/dL (ref 8.9–10.3)
Creatinine, Ser: 0.81 mg/dL (ref 0.61–1.24)
Glucose, Bld: 97 mg/dL (ref 65–99)
POTASSIUM: 4.4 mmol/L (ref 3.5–5.1)
Sodium: 141 mmol/L (ref 135–145)
TOTAL PROTEIN: 6.1 g/dL — AB (ref 6.5–8.1)

## 2016-07-19 LAB — CBC
HCT: 49.8 % (ref 39.0–52.0)
Hemoglobin: 17.4 g/dL — ABNORMAL HIGH (ref 13.0–17.0)
MCH: 32.8 pg (ref 26.0–34.0)
MCHC: 34.9 g/dL (ref 30.0–36.0)
MCV: 94 fL (ref 78.0–100.0)
PLATELETS: 130 10*3/uL — AB (ref 150–400)
RBC: 5.3 MIL/uL (ref 4.22–5.81)
RDW: 12.4 % (ref 11.5–15.5)
WBC: 7.8 10*3/uL (ref 4.0–10.5)

## 2016-07-19 LAB — HEPARIN LEVEL (UNFRACTIONATED)
HEPARIN UNFRACTIONATED: 0.36 [IU]/mL (ref 0.30–0.70)
HEPARIN UNFRACTIONATED: 0.23 [IU]/mL — AB (ref 0.30–0.70)

## 2016-07-19 SURGERY — LOWER EXTREMITY VENOGRAPHY
Anesthesia: LOCAL

## 2016-07-19 MED ORDER — ONDANSETRON HCL 4 MG/2ML IJ SOLN: 4.0000 mg | Freq: Four times a day (QID) | INTRAMUSCULAR | Status: DC | PRN

## 2016-07-19 MED ORDER — METOPROLOL TARTRATE 5 MG/5ML IV SOLN
2.0000 mg | INTRAVENOUS | Status: DC | PRN
Start: 2016-07-19 — End: 2016-07-20

## 2016-07-19 MED ORDER — LIDOCAINE HCL (PF) 1 % IJ SOLN
INTRAMUSCULAR | Status: AC
  Filled 2016-07-19: qty 30

## 2016-07-19 MED ORDER — OXYCODONE-ACETAMINOPHEN 5-325 MG PO TABS
1.0000 | ORAL_TABLET | ORAL | Status: DC | PRN
  Administered 2016-07-19 – 2016-07-20 (×2): 2 via ORAL
  Filled 2016-07-19 (×2): qty 2

## 2016-07-19 MED ORDER — MIDAZOLAM HCL 2 MG/2ML IJ SOLN
INTRAMUSCULAR | Status: AC
  Filled 2016-07-19: qty 2

## 2016-07-19 MED ORDER — RIVAROXABAN 15 MG PO TABS
15.0000 mg | ORAL_TABLET | Freq: Two times a day (BID) | ORAL | Status: DC
  Administered 2016-07-19 – 2016-07-20 (×2): 15 mg via ORAL
  Filled 2016-07-19: qty 1

## 2016-07-19 MED ORDER — RIVAROXABAN 20 MG PO TABS: 20.0000 mg | ORAL_TABLET | Freq: Every day | ORAL | 3 refills | Status: DC

## 2016-07-19 MED ORDER — RIVAROXABAN 20 MG PO TABS: 20.0000 mg | ORAL_TABLET | Freq: Every day | ORAL | Status: DC

## 2016-07-19 MED ORDER — HEPARIN (PORCINE) IN NACL 2-0.9 UNIT/ML-% IJ SOLN
INTRAMUSCULAR | Status: AC
  Filled 2016-07-19: qty 1000

## 2016-07-19 MED ORDER — HEPARIN BOLUS VIA INFUSION
1000.0000 [IU] | Freq: Once | INTRAVENOUS | Status: DC
  Filled 2016-07-19: qty 1000

## 2016-07-19 MED ORDER — FENTANYL CITRATE (PF) 100 MCG/2ML IJ SOLN
INTRAMUSCULAR | Status: DC | PRN
  Administered 2016-07-19: 25 ug via INTRAVENOUS

## 2016-07-19 MED ORDER — PHENOL 1.4 % MT LIQD: 1.0000 | OROMUCOSAL | Status: DC | PRN

## 2016-07-19 MED ORDER — ACETAMINOPHEN 325 MG PO TABS: 325.0000 mg | ORAL_TABLET | ORAL | Status: DC | PRN

## 2016-07-19 MED ORDER — GUAIFENESIN-DM 100-10 MG/5ML PO SYRP: 15.0000 mL | ORAL_SOLUTION | ORAL | Status: DC | PRN

## 2016-07-19 MED ORDER — IODIXANOL 320 MG/ML IV SOLN
INTRAVENOUS | Status: DC | PRN
  Administered 2016-07-19: 30 mL via INTRAVENOUS

## 2016-07-19 MED ORDER — PANTOPRAZOLE SODIUM 40 MG PO TBEC
40.0000 mg | DELAYED_RELEASE_TABLET | Freq: Every day | ORAL | Status: DC
  Administered 2016-07-19: 40 mg via ORAL
  Filled 2016-07-19 (×2): qty 1

## 2016-07-19 MED ORDER — SODIUM CHLORIDE 0.9 % IV SOLN
INTRAVENOUS | Status: DC
  Administered 2016-07-19: 01:00:00 via INTRAVENOUS

## 2016-07-19 MED ORDER — RIVAROXABAN (XARELTO) VTE STARTER PACK (15 & 20 MG): ORAL_TABLET | ORAL | 0 refills | Status: DC

## 2016-07-19 MED ORDER — ACETAMINOPHEN 325 MG RE SUPP: 325.0000 mg | RECTAL | Status: DC | PRN

## 2016-07-19 MED ORDER — POTASSIUM CHLORIDE CRYS ER 20 MEQ PO TBCR
20.0000 meq | EXTENDED_RELEASE_TABLET | Freq: Once | ORAL | Status: AC
  Administered 2016-07-19: 20 meq via ORAL
  Filled 2016-07-19: qty 1

## 2016-07-19 MED ORDER — SODIUM CHLORIDE 0.45 % IV SOLN
INTRAVENOUS | Status: DC
  Administered 2016-07-19: 19:00:00 via INTRAVENOUS

## 2016-07-19 MED ORDER — LABETALOL HCL 5 MG/ML IV SOLN: 10.0000 mg | INTRAVENOUS | Status: DC | PRN

## 2016-07-19 MED ORDER — HEPARIN SODIUM (PORCINE) 1000 UNIT/ML IJ SOLN
INTRAMUSCULAR | Status: AC
  Filled 2016-07-19: qty 1

## 2016-07-19 MED ORDER — HEPARIN (PORCINE) IN NACL 2-0.9 UNIT/ML-% IJ SOLN
INTRAMUSCULAR | Status: DC | PRN
Start: 2016-07-19 — End: 2016-07-19
  Administered 2016-07-19: 500 mL

## 2016-07-19 MED ORDER — MAGNESIUM SULFATE 2 GM/50ML IV SOLN
2.0000 g | Freq: Every day | INTRAVENOUS | Status: DC | PRN
  Filled 2016-07-19: qty 50

## 2016-07-19 MED ORDER — MORPHINE SULFATE (PF) 2 MG/ML IV SOLN: 2.0000 mg | INTRAVENOUS | Status: DC | PRN

## 2016-07-19 MED ORDER — FENTANYL CITRATE (PF) 100 MCG/2ML IJ SOLN
INTRAMUSCULAR | Status: AC
  Filled 2016-07-19: qty 2

## 2016-07-19 MED ORDER — POTASSIUM CHLORIDE CRYS ER 20 MEQ PO TBCR: 20.0000 meq | EXTENDED_RELEASE_TABLET | Freq: Every day | ORAL | Status: DC | PRN

## 2016-07-19 MED ORDER — LIDOCAINE HCL (PF) 1 % IJ SOLN
INTRAMUSCULAR | Status: DC | PRN
  Administered 2016-07-19: 20 mL via INTRADERMAL

## 2016-07-19 MED ORDER — MIDAZOLAM HCL 2 MG/2ML IJ SOLN
INTRAMUSCULAR | Status: DC | PRN
  Administered 2016-07-19: 2 mg via INTRAVENOUS

## 2016-07-19 MED ORDER — HYDRALAZINE HCL 20 MG/ML IJ SOLN: 5.0000 mg | INTRAMUSCULAR | Status: DC | PRN

## 2016-07-19 MED ORDER — SODIUM CHLORIDE 0.9 % IV SOLN
10.0000 mg | INTRAVENOUS | Status: AC
  Administered 2016-07-19: 10 mg via INTRAVENOUS
  Filled 2016-07-19: qty 10

## 2016-07-19 MED ORDER — ALUM & MAG HYDROXIDE-SIMETH 200-200-20 MG/5ML PO SUSP: 15.0000 mL | ORAL | Status: DC | PRN

## 2016-07-19 SURGICAL SUPPLY — 13 items
BALLN MUSTANG 10.0X40 75 (BALLOONS) ×4
BALLOON MUSTANG 10.0X40 75 (BALLOONS) IMPLANT
CATH ANGIO 5F BER2 65CM (CATHETERS) ×1 IMPLANT
CATH VISIONS PV .035 IVUS (CATHETERS) ×1 IMPLANT
KIT ENCORE 26 ADVANTAGE (KITS) ×1 IMPLANT
KIT MICROINTRODUCER STIFF 5F (SHEATH) ×1 IMPLANT
KIT PV (KITS) ×4 IMPLANT
SET ZELANTE DVT THROMB (CATHETERS) ×1 IMPLANT
SHEATH PINNACLE 9F 10CM (SHEATH) ×1 IMPLANT
TRANSDUCER W/STOPCOCK (MISCELLANEOUS) ×4 IMPLANT
TRAY PV CATH (CUSTOM PROCEDURE TRAY) ×4 IMPLANT
WIRE AMPLATZ SS-J .035X260CM (WIRE) ×1 IMPLANT
WIRE HITORQ VERSACORE ST 145CM (WIRE) ×1 IMPLANT

## 2016-07-19 NOTE — Progress Notes (Addendum)
ANTICOAGULATION CONSULT NOTE - Initial Consult  Pharmacy Consult for Xarelto Indication: DVT  Allergies  Allergen Reactions  . Thimerosal Other (See Comments)    Redness    Patient Measurements: Height: 6' (182.9 cm) Weight: 215 lb (97.5 kg) IBW/kg (Calculated) : 77.6  Vital Signs: Temp: 98.3 F (36.8 C) (01/05 1344) Temp Source: Oral (01/05 1344) BP: 132/78 (01/05 1629) Pulse Rate: 0 (01/05 1629)  Labs:  Recent Labs  07/18/16 2103 07/19/16 0325 07/19/16 1159  HGB 19.0* 17.4*  --   HCT 52.2* 49.8  --   PLT 128* 130*  --   APTT 34  --   --   LABPROT 13.4  --   --   INR 1.02  --   --   HEPARINUNFRC  --  0.36 0.23*  CREATININE 0.91 0.81  --    Estimated Creatinine Clearance: 127.7 mL/min (by C-G formula based on SCr of 0.81 mg/dL).  Medical History: Past Medical History:  Diagnosis Date  . Costochondritis   . Diabetes mellitus without complication (HCC)   . DVT (deep venous thrombosis) [facility](HCC)    Assessment: 56 y.o. male is here with recurrent extensive left lower extremity dvt without provoking factor other than smoking and moderate travel. Patient now s/p ultrasound-guided cannulation and thrombectomy of femoral and popliteal veins. Pharmacy will dose Xarelto, without the need for a heparin bridge.  Goal of Therapy:  aPTT 66-102 seconds Monitor platelets by anticoagulation protocol: Yes   Plan:  Xarelto 15mg  x21 days, then 20mg  daily Monitor CBC/renal function and for s/sx of bleeding  Matei Magnone L Cameshia Cressman 07/19/2016,5:35 PM

## 2016-07-19 NOTE — Progress Notes (Signed)
  Progress Note    07/19/2016 8:52 AM * No surgery date entered *  Subjective:  Left leg pain improving  Vitals:   07/19/16 0052 07/19/16 0450  BP: 128/77 132/78  Pulse: 87 85  Resp: 20 18  Temp: 98.5 F (36.9 C) 98.2 F (36.8 C)    Physical Exam: aaox3 Abdomen is soft Left leg with non-pitting edema, ttp has improved Palpable pedal pulses  CBC    Component Value Date/Time   WBC 7.8 07/19/2016 0325   RBC 5.30 07/19/2016 0325   HGB 17.4 (H) 07/19/2016 0325   HCT 49.8 07/19/2016 0325   PLT 130 (L) 07/19/2016 0325   MCV 94.0 07/19/2016 0325   MCH 32.8 07/19/2016 0325   MCHC 34.9 07/19/2016 0325   RDW 12.4 07/19/2016 0325   LYMPHSABS 2.0 07/18/2016 2103   MONOABS 1.2 (H) 07/18/2016 2103   EOSABS 0.1 07/18/2016 2103   BASOSABS 0.0 07/18/2016 2103    BMET    Component Value Date/Time   NA 141 07/19/2016 0325   K 4.4 07/19/2016 0325   CL 104 07/19/2016 0325   CO2 31 07/19/2016 0325   GLUCOSE 97 07/19/2016 0325   BUN 10 07/19/2016 0325   CREATININE 0.81 07/19/2016 0325   CREATININE 0.76 07/03/2015 1151   CALCIUM 9.0 07/19/2016 0325   GFRNONAA >60 07/19/2016 0325   GFRNONAA >89 03/03/2014 1534   GFRAA >60 07/19/2016 0325   GFRAA >89 03/03/2014 1534    INR    Component Value Date/Time   INR 1.02 07/18/2016 2103    No intake or output data in the 24 hours ending 07/19/16 0852   Assessment:  56 y.o. male is here with recurrent extensive left lower extremity dvt without provoking factor other than smoking and moderate travel  Plan: -ivc and iliac vein duplex today -Dr. Fields to perform venogram with possible lysis, ivus and possible intervention -patient understands procedure is to possibly determine provoking source such as iliac vein compression and lessen clot burden to hopefully reduce risk of severe post-thrombotic syndrome -DVT prophylaxis:  Therapeutic heparin   Porche Steinberger C. Lucah Petta, MD [facility] [place] Office:  [phone] Pager: [phone]  07/19/2016 8:52 AM  

## 2016-07-19 NOTE — Progress Notes (Signed)
ANTICOAGULATION CONSULT NOTE - Follow Up Consult  Pharmacy Consult for Heparin Indication: DVT  Allergies  Allergen Reactions  . Thimerosal Other (See Comments)    Redness    Patient Measurements: Height: 6' (182.9 cm) Weight: 215 lb (97.5 kg) IBW/kg (Calculated) : 77.6  Vital Signs: Temp: 98.5 F (36.9 C) (01/05 0052) Temp Source: Oral (01/05 0052) BP: 128/77 (01/05 0052) Pulse Rate: 87 (01/05 0052)  Labs:  Recent Labs  07/18/16 2103 07/19/16 0325  HGB 19.0* 17.4*  HCT 52.2* 49.8  PLT 128* 130*  APTT 34  --   LABPROT 13.4  --   INR 1.02  --   HEPARINUNFRC  --  0.36  CREATININE 0.91 0.81    Estimated Creatinine Clearance: 127.7 mL/min (by C-G formula based on SCr of 0.81 mg/dL).   Assessment: [age] YOM who presents with L leg pain and swelling, found to have DVT on doppler. History of DVT ~3 years ago and was on warfarin for treatment at that time (for months to a year per patient), not currently on any anticoagulants.   Initial heparin level this AM is therapeutic  Goal of Therapy:  Heparin level 0.3-0.7 units/ml Monitor platelets by anticoagulation protocol: Yes   Plan:  -Cont heparin at 1650 units/hr -1200 HL  Abran DukeLedford, Luise Yamamoto 07/19/2016,4:34 AM

## 2016-07-19 NOTE — Op Note (Signed)
Procedure: Ultrasound-guided cannulation of left popliteal vein, intravascular ultrasound vena cava common and external iliac superficial femoral vein and popliteal vein, thrombectomy of left superficial femoral and popliteal vein using Zelante AngioJet catheter  Preoperative diagnosis: Acute DVT left lower extremity  Postoperative diagnosis: Same  Anesthesia: Local with IV sedation  Operative findings: Acute DVT left popliteal superficial femoral vein no evidence of iliac venous stenosis  Operative details: After obtaining informed consent, the patient stated the PV lab. The patient was placed in prone position on the Angio table. Next using ultrasound guidance the popliteal vein was cannulated just below the knee crease. This was done using a micropuncture needle. The micropuncture wire was easily advanced into the vein and the micropuncture sheath placed over this. This was then exchanged for an 035 versacore wire and a 9 French sheath placed over this into the popliteal vein. An Amplatz wire was then placed after exchanging the wire through a 5 French catheter and Amplatz wire advanced all the way to the level of the superior vena cava. At this point intravascular ultrasound was performed going from the inferior vena cava down into the left common iliac external iliac and common femoral vein as well as the superficial femoral and popliteal veins. The inferior vena cava common iliac and external iliac veins were all widely patent with no evidence of compression. The common femoral vein was also patent without evidence of compression or thrombus. There was diffuse thrombus throughout the superficial femoral and popliteal vein. At this point the ibis catheter was removed and replaced with the ZELENTE CATHETER.  At this point the zelente catheter was advanced while delivering a total of 10 mg of TPA diffusely throughout the popliteal and superficial femoral veins. A 10 x 4 angioplasty balloon was then  advanced up to the level of the superficial femoral vein and this was inflated multiple times diffusely throughout the vein all the way back down the popliteal to macerate clot. After the TPA had been with a dwell time of 15 minutes, the is along the catheter was placed back in and thrombolysis was begun. A total of 200 seconds of thrombolysis was performed with one brief insertion of the ibis catheter to again confirm where the most thickened portions of the vein were. After removal of his along take catheter a contrast venogram was performed. There were some extravasation of contrast for the sheath and pulled back and was out of the vein. There is a small amount of residual thrombus within the popliteal vein but this was significantly improved. There is also 1 segment of the mid superficial femoral vein that did have some residual DVT. Since we were at our safety dwell time with inflow through the vein I decided that the remainder of this we will leave in place to be treated with anticoagulation. At this point the patient's sheath was removed. Hemostasis was obtained with direct pressure. The patient tolerated procedure well and there were no complications. Patient was taken back to the holding area in stable condition.  Operative management: Patient will be started on Xarelto this evening. His heparin drip will be continued anteriorly is therapeutic on his Xarelto. Plan will be most likely discharge home tomorrow.  Fabienne Brunsharles Najae Filsaime, MD [facility] [place] Office: [phone] Pager: [phone]

## 2016-07-19 NOTE — H&P (View-Only) (Signed)
  Progress Note    07/19/2016 8:52 AM * No surgery date entered *  Subjective:  Left leg pain improving  Vitals:   07/19/16 0052 07/19/16 0450  BP: 128/77 132/78  Pulse: 87 85  Resp: 20 18  Temp: 98.5 F (36.9 C) 98.2 F (36.8 C)    Physical Exam: aaox3 Abdomen is soft Left leg with non-pitting edema, ttp has improved Palpable pedal pulses  CBC    Component Value Date/Time   WBC 7.8 07/19/2016 0325   RBC 5.30 07/19/2016 0325   HGB 17.4 (H) 07/19/2016 0325   HCT 49.8 07/19/2016 0325   PLT 130 (L) 07/19/2016 0325   MCV 94.0 07/19/2016 0325   MCH 32.8 07/19/2016 0325   MCHC 34.9 07/19/2016 0325   RDW 12.4 07/19/2016 0325   LYMPHSABS 2.0 07/18/2016 2103   MONOABS 1.2 (H) 07/18/2016 2103   EOSABS 0.1 07/18/2016 2103   BASOSABS 0.0 07/18/2016 2103    BMET    Component Value Date/Time   NA 141 07/19/2016 0325   K 4.4 07/19/2016 0325   CL 104 07/19/2016 0325   CO2 31 07/19/2016 0325   GLUCOSE 97 07/19/2016 0325   BUN 10 07/19/2016 0325   CREATININE 0.81 07/19/2016 0325   CREATININE 0.76 07/03/2015 1151   CALCIUM 9.0 07/19/2016 0325   GFRNONAA >60 07/19/2016 0325   GFRNONAA >89 03/03/2014 1534   GFRAA >60 07/19/2016 0325   GFRAA >89 03/03/2014 1534    INR    Component Value Date/Time   INR 1.02 07/18/2016 2103    No intake or output data in the 24 hours ending 07/19/16 [id number]   Assessment:  56 y.o. male is here with recurrent extensive left lower extremity dvt without provoking factor other than smoking and moderate travel  Plan: -ivc and iliac vein duplex today -Dr. Darrick PennaFields to perform venogram with possible lysis, ivus and possible intervention -patient understands procedure is to possibly determine provoking source such as iliac vein compression and lessen clot burden to hopefully reduce risk of severe post-thrombotic syndrome -DVT prophylaxis:  Therapeutic heparin   Senaida Chilcote C. Randie Heinzain, MD [facility] [place] Office:  [phone] Pager: [phone]  07/19/2016 8:52 AM

## 2016-07-19 NOTE — Interval H&P Note (Signed)
History and Physical Interval Note:  07/19/2016 3:06 PM  Scott Quinn  has presented today for surgery, with the diagnosis of DVT  The various methods of treatment have been discussed with the patient and family. After consideration of risks, benefits and other options for treatment, the patient has consented to  Procedure(s): Lower Extremity Venography (Left) as a surgical intervention .  The patient's history has been reviewed, patient examined, no change in status, stable for surgery.  I have reviewed the patient's chart and labs.  Questions were answered to the patient's satisfaction.     Fabienne BrunsFields, Charles

## 2016-07-19 NOTE — Progress Notes (Signed)
Feels ok Some soreness left inner thigh when coughs No hematoma groin thigh or popliteal on left Continue heparin Check CBC Check BMET in am with thrombolysis pigment load Keep well hydrated  Fabienne Brunsharles Ronin Rehfeldt, MD [facility] [place] Office: [phone] Pager: [phone]

## 2016-07-19 NOTE — Progress Notes (Addendum)
Preliminary results by tech - IVC/Iliac Veins Duplex Completed. The IVC and iliac veins are patent without evidence of thrombus noted.  Marilynne Halstedita Kemba Hoppes, BS, RDMS, RVT

## 2016-07-19 NOTE — Progress Notes (Signed)
ANTICOAGULATION CONSULT NOTE - Follow Up Consult  Pharmacy Consult for Heparin Indication: DVT  Allergies  Allergen Reactions  . Thimerosal Other (See Comments)    Redness    Patient Measurements: Height: 6' (182.9 cm) Weight: 215 lb (97.5 kg) IBW/kg (Calculated) : 77.6  Vital Signs: Temp: 98.3 F (36.8 C) (01/05 1344) Temp Source: Oral (01/05 1344) BP: 113/79 (01/05 1344) Pulse Rate: 77 (01/05 1344)  Labs:  Recent Labs  07/18/16 2103 07/19/16 0325 07/19/16 1159  HGB 19.0* 17.4*  --   HCT 52.2* 49.8  --   PLT 128* 130*  --   APTT 34  --   --   LABPROT 13.4  --   --   INR 1.02  --   --   HEPARINUNFRC  --  0.36 0.23*  CREATININE 0.91 0.81  --     Estimated Creatinine Clearance: 127.7 mL/min (by C-G formula based on SCr of 0.81 mg/dL).   Assessment: [age] YOM who presents with L leg pain and swelling, found to have DVT on doppler. History of DVT ~3 years ago and was on warfarin for treatment at that time (for months to a year per patient), not currently on any anticoagulants.  Plans noted for venogram with possible lysis, ivus and possible intervention -heparin level is below goal on 1650 units/hr  Goal of Therapy:  Heparin level 0.3-0.7 units/ml Monitor platelets by anticoagulation protocol: Yes   Plan:  -Heparin bolus 1000 units and increase to 1800 units/hr -Heparin level in 6 hours and daily wth CBC daily  Harland GermanAndrew Brynlei Klausner, Pharm D 07/19/2016 2:08 PM

## 2016-07-20 LAB — BASIC METABOLIC PANEL
Anion gap: 6 (ref 5–15)
BUN: 8 mg/dL (ref 6–20)
CO2: 28 mmol/L (ref 22–32)
CREATININE: 0.77 mg/dL (ref 0.61–1.24)
Calcium: 8.9 mg/dL (ref 8.9–10.3)
Chloride: 104 mmol/L (ref 101–111)
GFR calc non Af Amer: >60 mL/min (ref >60)
Glucose, Bld: 92 mg/dL (ref 65–99)
Potassium: 4.2 mmol/L (ref 3.5–5.1)
SODIUM: 138 mmol/L (ref 135–145)

## 2016-07-20 LAB — CBC
HCT: 48.7 % (ref 39.0–52.0)
Hemoglobin: 16.7 g/dL (ref 13.0–17.0)
MCH: 32.5 pg (ref 26.0–34.0)
MCHC: 34.3 g/dL (ref 30.0–36.0)
MCV: 94.7 fL (ref 78.0–100.0)
Platelets: 115 10*3/uL — ABNORMAL LOW (ref 150–400)
RBC: 5.14 MIL/uL (ref 4.22–5.81)
RDW: 12.6 % (ref 11.5–15.5)
WBC: 6.9 10*3/uL (ref 4.0–10.5)

## 2016-07-20 NOTE — Discharge Instructions (Signed)
Rivaroxaban oral tablets What is this medicine? RIVAROXABAN (ri va ROX a ban) is an anticoagulant (blood thinner). It is used to treat blood clots in the lungs or in the veins. It is also used after knee or hip surgeries to prevent blood clots. It is also used to lower the chance of stroke in people with a medical condition called atrial fibrillation. COMMON BRAND NAME(S): Xarelto, Xarelto Starter Pack What should I tell my health care provider before I take this medicine? They need to know if you have any of these conditions: -bleeding disorders -bleeding in the brain -blood in your stools (black or tarry stools) or if you have blood in your vomit -history of stomach bleeding -kidney disease -liver disease -low blood counts, like low white cell, platelet, or red cell counts -recent or planned spinal or epidural procedure -take medicines that treat or prevent blood clots -an unusual or allergic reaction to rivaroxaban, other medicines, foods, dyes, or preservatives -pregnant or trying to get pregnant -breast-feeding How should I use this medicine? Take this medicine by mouth with a glass of water. Follow the directions on the prescription label. Take your medicine at regular intervals. Do not take it more often than directed. Do not stop taking except on your doctor's advice. Stopping this medicine may increase your risk of a blood clot. Be sure to refill your prescription before you run out of medicine. If you are taking this medicine after hip or knee replacement surgery, take it with or without food. If you are taking this medicine for atrial fibrillation, take it with your evening meal. If you are taking this medicine to treat blood clots, take it with food at the same time each day. If you are unable to swallow your tablet, you may crush the tablet and mix it in applesauce. Then, immediately eat the applesauce. You should eat more food right after you eat the applesauce containing the  crushed tablet. Talk to your pediatrician regarding the use of this medicine in children. Special care may be needed. What if I miss a dose? If you take your medicine once a day and miss a dose, take the missed dose as soon as you remember. If you take your medicine twice a day and miss a dose, take the missed dose immediately. In this instance, 2 tablets may be taken at the same time. The next day you should take 1 tablet twice a day as directed. What may interact with this medicine? Do not take this medicine with any of the following medications: -defibrotideThis medicine may also interact with the following medications: -aspirin and aspirin-like medicines -certain antibiotics like erythromycin, azithromycin, and clarithromycin -certain medicines for fungal infections like ketoconazole and itraconazole -certain medicines for irregular heart beat like amiodarone, quinidine, dronedarone -certain medicines for seizures like carbamazepine, phenytoin -certain medicines that treat or prevent blood clots like warfarin, enoxaparin, and dalteparin -conivaptan -diltiazem -felodipine -indinavir -lopinavir; ritonavir -NSAIDS, medicines for pain and inflammation, like ibuprofen or naproxen -ranolazine -rifampin -ritonavir -SNRIs, medicines for depression, like desvenlafaxine, duloxetine, levomilnacipran, venlafaxine -SSRIs, medicines for depression, like citalopram, escitalopram, fluoxetine, fluvoxamine, paroxetine, sertraline -St. John's wort -verapamil What should I watch for while using this medicine? Visit your doctor or health care professional for regular checks on your progress. Your condition will be monitored carefully while you are receiving this medicine. Notify your doctor or health care professional and seek emergency treatment if you develop breathing problems; changes in vision; chest pain; severe, sudden headache; pain, swelling, warmth in  the leg; trouble speaking; sudden numbness  or weakness of the face, arm, or leg. These can be signs that your condition has gotten worse. If you are going to have surgery, tell your doctor or health care professional that you are taking this medicine. Tell your health care professional that you use this medicine before you have a spinal or epidural procedure. Sometimes people who take this medicine have bleeding problems around the spine when they have a spinal or epidural procedure. This bleeding is very rare. If you have a spinal or epidural procedure while on this medicine, call your health care professional immediately if you have back pain, numbness or tingling (especially in your legs and feet), muscle weakness, paralysis, or loss of bladder or bowel control. Avoid sports and activities that might cause injury while you are using this medicine. Severe falls or injuries can cause unseen bleeding. Be careful when using sharp tools or knives. Consider using an Neurosurgeon. Take special care brushing or flossing your teeth. Report any injuries, bruising, or red spots on the skin to your doctor or health care professional. What side effects may I notice from receiving this medicine? Side effects that you should report to your doctor or health care professional as soon as possible: -allergic reactions like skin rash, itching or hives, swelling of the face, lips, or tongue -back pain -redness, blistering, peeling or loosening of the skin, including inside the mouth -signs and symptoms of bleeding such as bloody or black, tarry stools; red or dark-brown urine; spitting up blood or brown material that looks like coffee grounds; red spots on the skin; unusual bruising or bleeding from the eye, gums, or nose Side effects that usually do not require medical attention (report to your doctor or health care professional if they continue or are bothersome): -dizziness -muscle pain Where should I keep my medicine? Keep out of the reach of  children. Store at room temperature between 15 and 30 degrees C (59 and 86 degrees F). Throw away any unused medicine after the expiration date.  2017 Elsevier/Gold Standard (2015-08-03 11:19:11) Deep Vein Thrombosis A deep vein thrombosis (DVT) is a blood clot (thrombus) that usually occurs in a deep, larger vein of the lower leg or the pelvis, or in an upper extremity such as the arm. These are dangerous and can lead to serious and even life-threatening complications if the clot travels to the lungs. A DVT can damage the valves in your leg veins so that instead of flowing upward, the blood pools in the lower leg. This is called post-thrombotic syndrome, and it can result in pain, swelling, discoloration, and sores on the leg. What are the causes? A DVT is caused by the formation of a blood clot in your leg, pelvis, or arm. Usually, several things contribute to the formation of blood clots. A clot may develop when:  Your blood flow slows down.  Your vein becomes damaged in some way.  You have a condition that makes your blood clot more easily. What increases the risk? A DVT is more likely to develop in:  People who are older, especially over 85 years of age.  People who are overweight (obese).  People who sit or lie still for a long time, such as during long-distance travel (over 4 hours), bed rest, hospitalization, or during recovery from certain medical conditions like a stroke.  People who do not engage in much physical activity (sedentary lifestyle).  People who have chronic breathing disorders.  People who  have a personal or family history of blood clots or blood clotting disease.  People who have peripheral vascular disease (PVD), diabetes, or some types of cancer.  People who have heart disease, especially if the person had a recent heart attack or has congestive heart failure.  People who have neurological diseases that affect the legs (leg paresis).  People who have had  a traumatic injury, such as breaking a hip or leg.  People who have recently had major or lengthy surgery, especially on the hip, knee, or abdomen.  People who have had a central line placed inside a large vein.  People who take medicines that contain the hormone estrogen. These include birth control pills and hormone replacement therapy.  Pregnancy or during childbirth or the postpartum period.  Long plane flights (over 8 hours). What are the signs or symptoms?   Symptoms of a DVT can include:  Swelling of your leg or arm, especially if one side is much worse.  Warmth and redness of your leg or arm, especially if one side is much worse.  Pain in your arm or leg. If the clot is in your leg, symptoms may be more noticeable or worse when you stand or walk.  A feeling of pins and needles, if the clot is in the arm. The symptoms of a DVT that has traveled to the lungs (pulmonary embolism, PE) usually start suddenly and include:  Shortness of breath while active or at rest.  Coughing or coughing up blood or blood-tinged mucus.  Chest pain that is often worse with deep breaths.  Rapid or irregular heartbeat.  Feeling light-headed or dizzy.  Fainting.  Feeling anxious.  Sweating. There may also be pain and swelling in a leg if that is where the blood clot started. These symptoms may represent a serious problem that is an emergency. Do not wait to see if the symptoms will go away. Get medical help right away. Call your local emergency services (911 in the [place] Do not drive yourself to the hospital.  How is this diagnosed? Your health care provider will take a medical history and perform a physical exam. You may also have other tests, including:  Blood tests to assess the clotting properties of your blood.  Imaging tests, such as CT, ultrasound, MRI, X-ray, and other tests to see if you have clots anywhere in your body. How is this treated? After a DVT is identified, it can  be treated. The type of treatment that you receive depends on many factors, such as the cause of your DVT, your risk for bleeding or developing more clots, and other medical conditions that you have. Sometimes, a combination of treatments is necessary. Treatment options may be combined and include:  Monitoring the blood clot with ultrasound.  Taking medicines by mouth, such as newer blood thinners (anticoagulants), thrombolytics, or warfarin.  Taking anticoagulant medicine by injection or through an IV tube.  Wearing compression stockings or using different types ofdevices.  Surgery (rare) to remove the blood clot or to place a filter in your abdomen to stop the blood clot from traveling to your lungs. Treatments for a DVT are often divided into immediate treatment and long-term treatment (up to 3 months after DVT). You can work with your health care provider to choose the treatment program that is best for you. Follow these instructions at home: If you are taking a newer oral anticoagulant:  Take the medicine every single day at the same time each day.  Understand what foods and drugs interact with this medicine.  Understand that there are no regular blood tests required when using this medicine.  Understand the side effects of this medicine, including excessive bruising or bleeding. Ask your health care provider or pharmacist about other possible side effects. If you are taking warfarin:  Understand how to take warfarin and know which foods can affect how warfarin works in Public relations account executiveyour body.  Understand that it is dangerous to take too much or too little warfarin. Too much warfarin increases the risk of bleeding. Too little warfarin continues to allow the risk for blood clots.  Follow your PT and INR blood testing schedule. The PT and INR results allow your health care provider to adjust your dose of warfarin. It is very important that you have your PT and INR tested as often as told by your  health care provider.  Avoid major changes in your diet, or tell your health care provider before you change your diet. Arrange a visit with a registered dietitian to answer your questions. Many foods, especially foods that are high in vitamin K, can interfere with warfarin and affect the PT and INR results. Eat a consistent amount of foods that are high in vitamin K, such as:  Spinach, kale, broccoli, cabbage, collard greens, turnip greens, Brussels sprouts, peas, cauliflower, seaweed, and parsley.  Beef liver and pork liver.  Green tea.  Soybean oil.  Tell your health care provider about any and all medicines, vitamins, and supplements that you take, including aspirin and other over-the-counter anti-inflammatory medicines. Be especially cautious with aspirin and anti-inflammatory medicines. Do not take those before you ask your health care provider if it is safe to do so. This is important because many medicines can interfere with warfarin and affect the PT and INR results.  Do not start or stop taking any over-the-counter or prescription medicine unless your health care provider or pharmacist tells you to do so. If you take warfarin, you will also need to do these things:  Hold pressure over cuts for longer than usual.  Tell your dentist and other health care providers that you are taking warfarin before you have any procedures in which bleeding may occur.  Avoid alcohol or drink very small amounts. Tell your health care provider if you change your alcohol intake.  Do not use tobacco products, including cigarettes, chewing tobacco, and e-cigarettes. If you need help quitting, ask your health care provider.  Avoid contact sports. General instructions  Take over-the-counter and prescription medicines only as told by your health care provider. Anticoagulant medicines can have side effects, including easy bruising and difficulty stopping bleeding. If you are prescribed an anticoagulant,  you will also need to do these things:  Hold pressure over cuts for longer than usual.  Tell your dentist and other health care providers that you are taking anticoagulants before you have any procedures in which bleeding may occur.  Avoid contact sports.  Wear a medical alert bracelet or carry a medical alert card that says you have had a PE.  Ask your health care provider how soon you can go back to your normal activities. Stay active to prevent new blood clots from forming.  Make sure to exercise while traveling or when you have been sitting or standing for a long period of time. It is very important to exercise. Exercise your legs by walking or by tightening and relaxing your leg muscles often. Take frequent walks.  Wear compression stockings as told by  your health care provider to help prevent more blood clots from forming.  Do not use tobacco products, including cigarettes, chewing tobacco, and e-cigarettes. If you need help quitting, ask your health care provider.  Keep all follow-up appointments with your health care provider. This is important. How is this prevented? Take these actions to decrease your risk of developing another DVT:  Exercise regularly. For at least 30 minutes every day, engage in:  Activity that involves moving your arms and legs.  Activity that encourages good blood flow through your body by increasing your heart rate.  Exercise your arms and legs every hour during long-distance travel (over 4 hours). Drink plenty of water and avoid drinking alcohol while traveling.  Avoid sitting or lying in bed for long periods of time without moving your legs.  Maintain a weight that is appropriate for your height. Ask your health care provider what weight is healthy for you.  If you are a woman who is over 74 years of age, avoid unnecessary use of medicines that contain estrogen. These include birth control pills.  Do not smoke, especially if you take estrogen  medicines. If you need help quitting, ask your health care provider. If you are hospitalized, prevention measures may include:  Early walking after surgery, as soon as your health care provider says that it is safe.  Receiving anticoagulants to prevent blood clots.If you cannot take anticoagulants, other options may be available, such as wearing compression stockings or using different types of devices. Get help right away if:  You have new or increased pain, swelling, or redness in an arm or leg.  You have numbness or tingling in an arm or leg.  You have shortness of breath while active or at rest.  You have chest pain.  You have a rapid or irregular heartbeat.  You feel light-headed or dizzy.  You cough up blood.  You notice blood in your vomit, bowel movement, or urine. These symptoms may represent a serious problem that is an emergency. Do not wait to see if the symptoms will go away. Get medical help right away. Call your local emergency services (911 in the [place] Do not drive yourself to the hospital.  This information is not intended to replace advice given to you by your health care provider. Make sure you discuss any questions you have with your health care provider. Document Released: 07/01/2005 Document Revised: 12/07/2015 Document Reviewed: 10/26/2014 Elsevier Interactive Patient Education  2017 [employer].  Information on my medicine - XARELTO (rivaroxaban)  This medication education was reviewed with me or my healthcare representative as part of my discharge preparation.  WHY WAS XARELTO PRESCRIBED FOR YOU? Xarelto was prescribed to treat blood clots that may have been found in the veins of your legs (deep vein thrombosis) or in your lungs (pulmonary embolism) and to reduce the risk of them occurring again.  What do you need to know about Xarelto? The starting dose is one 15 mg tablet taken TWICE daily with food for the FIRST 21 DAYS then on (enter date)   08/09/2016  the dose is changed to one 20 mg tablet taken ONCE A DAY with your evening meal.  DO NOT stop taking Xarelto without talking to the health care provider who prescribed the medication.  Refill your prescription for 20 mg tablets before you run out.  After discharge, you should have regular check-up appointments with your healthcare provider that is prescribing your Xarelto.  In the future your dose may  need to be changed if your kidney function changes by a significant amount.  What do you do if you miss a dose? If you are taking Xarelto TWICE DAILY and you miss a dose, take it as soon as you remember. You may take two 15 mg tablets (total 30 mg) at the same time then resume your regularly scheduled 15 mg twice daily the next day.  If you are taking Xarelto ONCE DAILY and you miss a dose, take it as soon as you remember on the same day then continue your regularly scheduled once daily regimen the next day. Do not take two doses of Xarelto at the same time.   Important Safety Information Xarelto is a blood thinner medicine that can cause bleeding. You should call your healthcare provider right away if you experience any of the following: ? Bleeding from an injury or your nose that does not stop. ? Unusual colored urine (red or dark brown) or unusual colored stools (red or black). ? Unusual bruising for unknown reasons. ? A serious fall or if you hit your head (even if there is no bleeding).  Some medicines may interact with Xarelto and might increase your risk of bleeding while on Xarelto. To help avoid this, consult your healthcare provider or pharmacist prior to using any new prescription or non-prescription medications, including herbals, vitamins, non-steroidal anti-inflammatory drugs (NSAIDs) and supplements.  This website has more information on Xarelto: [email or web address].br.

## 2016-07-20 NOTE — Progress Notes (Signed)
Subjective: Interval History: none.. Stable this morning. Comfortable with mild soreness in the popliteal space. No swelling.   Objective: Vital signs in last 24 hours: Temp:  [98.3 F (36.8 C)-98.8 F (37.1 C)] 98.6 F (37 C) (01/06 0444) Pulse Rate:  [0-84] 81 (01/06 0444) Resp:  [11-21] 18 (01/06 0444) BP: (113-146)/(62-101) 113/62 (01/06 0444) SpO2:  [0 %-100 %] 95 % (01/06 0444)  Intake/Output from previous day: 01/05 0701 - 01/06 0700 In: 360 [P.O.:360] Out: -  Intake/Output this shift: No intake/output data recorded.  Palpable distal pulse on left. No significant swelling left popliteal space  Lab Results:  Recent Labs  07/19/16 0325 07/20/16 0330  WBC 7.8 6.9  HGB 17.4* 16.7  HCT 49.8 48.7  PLT 130* 115*   BMET  Recent Labs  07/19/16 0325 07/20/16 0330  NA 141 138  K 4.4 4.2  CL 104 104  CO2 31 28  GLUCOSE 97 92  BUN 10 8  CREATININE 0.81 0.77  CALCIUM 9.0 8.9    Studies/Results: No results found. Anti-infectives: Anti-infectives    None      Assessment/Plan: s/p Procedure(s) with comments: Lower Extremity Venography (Left) Intravascular Ultrasound/IVUS (N/A) Thrombectomy - left venous system  Stable for discharge today. Will be discharged on Xaralto   LOS: 2 days   Anthea Udovich 07/20/2016, 9:26 AM

## 2016-07-20 NOTE — Progress Notes (Signed)
Patient has been educated on all DC instructions and has no further questions or concerns.  Will continue to monitor for any changes.

## 2016-07-22 ENCOUNTER — Encounter (HOSPITAL_COMMUNITY): Payer: Self-pay | Admitting: Vascular Surgery

## 2016-07-23 ENCOUNTER — Other Ambulatory Visit: Payer: Self-pay | Admitting: *Deleted

## 2016-07-23 ENCOUNTER — Other Ambulatory Visit: Payer: Self-pay

## 2016-07-23 DIAGNOSIS — I82422 Acute embolism and thrombosis of left iliac vein: Secondary | ICD-10-CM

## 2016-07-23 DIAGNOSIS — I824Y9 Acute embolism and thrombosis of unspecified deep veins of unspecified proximal lower extremity: Secondary | ICD-10-CM

## 2016-07-23 DIAGNOSIS — Z9862 Peripheral vascular angioplasty status: Secondary | ICD-10-CM

## 2016-07-29 ENCOUNTER — Other Ambulatory Visit: Payer: Self-pay | Admitting: *Deleted

## 2016-07-29 MED ORDER — HYDROCODONE-ACETAMINOPHEN 5-325 MG PO TABS: 1.0000 | ORAL_TABLET | Freq: Four times a day (QID) | ORAL | 0 refills | Status: DC | PRN

## 2016-07-30 NOTE — Discharge Summary (Signed)
[facility] Discharge Summary  Scott Quinn Apr 07, 1961 56 y.o. male  [id number]  Admission Date: 07/18/2016  Discharge Date: 07/20/16  Physician: Fabienne Bruns, MD  Admission Diagnosis: Acute deep vein thrombosis (DVT) of left lower extremity, unspecified vein (HCC) [I82.402]  HPI:   This is a 56 y.o. male with a DVT in his left lower extremity that was treated with Coumadin therapy for what he thinks was 6 months and was discontinued after negative study. He now returns with a one-day history of left lower extremity swelling and pain and is concern for new DVT. He does not have any recent trauma to the left leg. 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 He has recently traveled but it was several weeks ago and he was driving. He is a daily smoker does not take aspirin or other blood thinners at this time. He has no other personal or any family history of DVT but does not know much of his family very well. He does not have a history of cancer but does have a history of a colon polyp but states he is out of date for his colonoscopy. He has no strokes in the past and does not have any bleeding although does have hemorrhoids that occasionally flare. He does not have fevers chills nausea vomiting headaches or other symptoms related to today's visit.   Hospital Course:  The patient was admitted to the hospital on 07/18/2016 and taken to the PV lab on 07/19/2016 and underwent: Ultrasound-guided cannulation of left popliteal vein, intravascular ultrasound vena cava common and external iliac superficial femoral vein and popliteal vein, thrombectomy of left superficial femoral and popliteal vein using Zelante AngioJet catheter    The patient tolerated the procedure well and was transported to the PACU in stable condition.   The patient did well post-op. He was continued on heparin and kept well hydrated. He had no hematoma of his groin, thigh, or popliteal area. He did have some  soreness in the left popliteal space. He had a palpable distal pulse on the left. He was discharged home on POD 1 on Xarelto.     CBC    Component Value Date/Time   WBC 6.9 07/20/2016 0330   RBC 5.14 07/20/2016 0330   HGB 16.7 07/20/2016 0330   HCT 48.7 07/20/2016 0330   PLT 115 (L) 07/20/2016 0330   MCV 94.7 07/20/2016 0330   MCH 32.5 07/20/2016 0330   MCHC 34.3 07/20/2016 0330   RDW 12.6 07/20/2016 0330   LYMPHSABS 2.0 07/18/2016 2103   MONOABS 1.2 (H) 07/18/2016 2103   EOSABS 0.1 07/18/2016 2103   BASOSABS 0.0 07/18/2016 2103    BMET    Component Value Date/Time   NA 138 07/20/2016 0330   K 4.2 07/20/2016 0330   CL 104 07/20/2016 0330   CO2 28 07/20/2016 0330   GLUCOSE 92 07/20/2016 0330   BUN 8 07/20/2016 0330   CREATININE 0.77 07/20/2016 0330   CREATININE 0.76 07/03/2015 1151   CALCIUM 8.9 07/20/2016 0330   GFRNONAA >60 07/20/2016 0330   GFRNONAA >89 03/03/2014 1534   GFRAA >60 07/20/2016 0330   GFRAA >89 03/03/2014 1534     Discharge Instructions:   The patient is discharged to home with extensive instructions on wound care and progressive ambulation.  They are instructed not to drive or perform any heavy lifting until returning to see the physician in his office.  Discharge Instructions    Call MD for:  redness, tenderness, or  signs of infection (pain, swelling, bleeding, redness, odor or green/yellow discharge around incision site)    Complete by:  As directed    Call MD for:  severe or increased pain, loss or decreased feeling  in affected limb(s)    Complete by:  As directed    Call MD for:  temperature >100.5    Complete by:  As directed    Discharge instructions    Complete by:  As directed    You may shower daily   Driving Restrictions    Complete by:  As directed    No driving for 1 week   Increase activity slowly    Complete by:  As directed    Walk with assistance use walker or cane as needed   Lifting restrictions    Complete by:  As  directed    No lifting for 3 weeks   Resume previous diet    Complete by:  As directed       Discharge Diagnosis:  Acute deep vein thrombosis (DVT) of left lower extremity, unspecified vein (HCC) [I82.402]  Secondary Diagnosis: Patient Active Problem List   Diagnosis Date Noted  . Type 2 diabetes mellitus (HCC) 07/03/2015  . Alcohol abuse 06/13/2015  . Colon cancer screening 03/03/2014  . Encounter for therapeutic drug monitoring 10/28/2013  . DVT (deep venous thrombosis) (HCC) 10/07/2013  . DVT of lower limb, acute (HCC) 09/22/2013  . Tobacco use 09/22/2013   Past Medical History:  Diagnosis Date  . Costochondritis   . Diabetes mellitus without complication (HCC)   . DVT (deep venous thrombosis) (HCC)      Allergies as of 07/20/2016      Reactions   Thimerosal Other (See Comments)   Redness       Medication List    TAKE these medications   Rivaroxaban 15 & 20 MG Tbpk Take as directed on package: Start with one 15mg  tablet by mouth twice a day with food. On Day 22, switch to one 20mg  tablet once a day with food.   rivaroxaban 20 MG Tabs tablet Commonly known as:  XARELTO Take 1 tablet (20 mg total) by mouth daily with supper. Start after starter pack completed Start taking on:  08/09/2016       Disposition: Home  Patient's condition: is Good  Follow up: 1. Dr. Darrick PennaFields in 4 weeks   Maris BergerKimberly Trinh, PA-C [facility] [phone] 07/30/2016  10:24 AM

## 2016-08-07 ENCOUNTER — Encounter: Payer: Self-pay | Admitting: Vascular Surgery

## 2016-08-12 ENCOUNTER — Encounter: Payer: Self-pay | Admitting: Vascular Surgery

## 2016-08-15 ENCOUNTER — Encounter: Payer: Self-pay | Admitting: Vascular Surgery

## 2016-08-15 ENCOUNTER — Ambulatory Visit (HOSPITAL_COMMUNITY)
Admission: RE | Admit: 2016-08-15 | Discharge: 2016-08-15 | Disposition: A | Discharge status: Home / Self Care | Payer: BLUE CROSS/BLUE SHIELD | Source: Ambulatory Visit | Attending: Vascular Surgery | Admitting: Vascular Surgery

## 2016-08-15 ENCOUNTER — Ambulatory Visit (INDEPENDENT_AMBULATORY_CARE_PROVIDER_SITE_OTHER): Payer: BLUE CROSS/BLUE SHIELD | Admitting: Vascular Surgery

## 2016-08-15 VITALS — BP 126/79 | HR 102 | Temp 98.3°F | Resp 20 | Ht 72.0 in | Wt 222.5 lb

## 2016-08-15 DIAGNOSIS — Z86718 Personal history of other venous thrombosis and embolism: Secondary | ICD-10-CM | POA: Insufficient documentation

## 2016-08-15 DIAGNOSIS — Z9862 Peripheral vascular angioplasty status: Secondary | ICD-10-CM | POA: Diagnosis not present

## 2016-08-15 DIAGNOSIS — I82412 Acute embolism and thrombosis of left femoral vein: Secondary | ICD-10-CM | POA: Diagnosis not present

## 2016-08-15 DIAGNOSIS — I82422 Acute embolism and thrombosis of left iliac vein: Secondary | ICD-10-CM | POA: Diagnosis not present

## 2016-08-15 DIAGNOSIS — I824Y9 Acute embolism and thrombosis of unspecified deep veins of unspecified proximal lower extremity: Secondary | ICD-10-CM

## 2016-08-15 NOTE — Progress Notes (Signed)
Patient name: Scott Quinn MRN: [id number] DOB: [birth year] Sex: male  HPI: Scott Quinn is a 56 y.o. male he returns today after mechanical thrombolysis of a left external iliac femoral and popliteal thrombolysis for DVT. He had no component of venous narrowing and did not require stenting. He states that his left leg swelling has improved but has not completely resolved. He has been compliant with compression stockings. He complains of some numbness in the left heel. He is on Xarelto. Other medical problems include diabetes which is currently stable.  Past Medical History:  Diagnosis Date  . Costochondritis   . Diabetes mellitus without complication (HCC)   . DVT (deep venous thrombosis) (HCC)    Past Surgical History:  Procedure Laterality Date  . BACK SURGERY    . CARDIAC CATHETERIZATION N/A 07/19/2016   Procedure: Intravascular Ultrasound/IVUS;  Surgeon: Sherren Kernsharles E Jonae Renshaw, MD;  [facility];  Service: Cardiovascular;  Laterality: N/A;  . INNER EAR SURGERY    . JOINT REPLACEMENT     knee surgery  . NASAL SEPTUM SURGERY    . PERIPHERAL VASCULAR CATHETERIZATION Left 07/19/2016   Procedure: Lower Extremity Venography;  Surgeon: Sherren Kernsharles E Milcah Dulany, MD;  [facility];  Service: Cardiovascular;  Laterality: Left;  . PERIPHERAL VASCULAR CATHETERIZATION  07/19/2016   Procedure: Thrombectomy;  Surgeon: Sherren Kernsharles E Tanisha Lutes, MD;  [facility];  Service: Cardiovascular;;  left venous system     History reviewed. No pertinent family history.  SOCIAL HISTORY: Social History   Social History  . Marital status: Single    Spouse name: N/A  . Number of children: N/A  . Years of education: N/A   Occupational History  . Not on file.   Social History Main Topics  . Smoking status: Current Every Day Smoker    Packs/day: 0.25    Years: 30.00    Types: Cigarettes  . Smokeless tobacco: Never Used  . Alcohol use 1.8 oz/week    3 Cans of beer  per week     Comment: reported on 07/03/15  . Drug use: No  . Sexual activity: Yes   Other Topics Concern  . Not on file   Social History Narrative  . No narrative on file    Allergies  Allergen Reactions  . Thimerosal Other (See Comments)    Redness     Current Outpatient Prescriptions  Medication Sig Dispense Refill  . HYDROcodone-acetaminophen (NORCO/VICODIN) 5-325 MG tablet Take 1 tablet by mouth every 6 (six) hours as needed. 20 tablet 0  . rivaroxaban (XARELTO) 20 MG TABS tablet Take 1 tablet (20 mg total) by mouth daily with supper. Start after starter pack completed 30 tablet 3  . Rivaroxaban 15 & 20 MG TBPK Take as directed on package: Start with one 15mg  tablet by mouth twice a day with food. On Day 22, switch to one 20mg  tablet once a day with food. (Patient not taking: Reported on 08/15/2016) 51 each 0   No current facility-administered medications for this visit.    Review of systems: He denies shortness of breath. He denies hemoptysis. He denies chest pain.  Physical Examination  Vitals:   08/15/16 1230  BP: 126/79  Pulse: (!) 102  Resp: 20  Temp: 98.3 F (36.8 C)  TempSrc: Oral  SpO2: 95%  Weight: 222 lb 8 oz (100.9 kg)  Height: 6' (1.829 m)    Body mass index is 30.18 kg/m.  General:  Alert  and oriented, no acute distress Skin: No rash, scattered superficial varicosities foot greater than right Extremity Pulses:  2+ radial, brachial, femoral, dorsalis pedis pulses bilaterally, left foot slightly more bluish few than right Musculoskeletal: No deformity some left lower extremity edema approximately 15-20% larger than right  Neurologic: Upper and lower extremity motor 5/5 and symmetric, some decreased sensation to light touch in the heel and Achilles area   DATA:  Patient had a venous duplex ultrasound today which showed patency of the inferior vena cava and iliac femoral venous system.  ASSESSMENT:  Status post thrombolysis left leg DVT. He still has  some swelling in the left leg. Hopefully this will continue to resolve with his anticoagulation. He still will be at some risk of postphlebitic syndrome and I have encouraged him to wear his compression stockings as much as possible. We will plan on continuing his anticoagulation for at least 6 months and then consider whether or not to stop it after that.   PLAN:  He will follow-up with me in 5 months time with a venous duplex at that time. He can resume normal activities. He should try to wear his compression stockings as much as possible. As far as the left heel numbness is concerned. He states that this only started after his procedure. 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 Hopefully this will continue to resolve with time as well. He is reassured today that he is not at risk of limb loss since this is a venous pathology not arterial which was one of his concerns.   Fabienne Bruns, MD [facility] [place] Office: [phone] Pager: [phone]

## 2016-08-20 NOTE — Addendum Note (Signed)
Addended by: Burton ApleyPETTY, Bobby Barton A on: 08/20/2016 01:07 PM   Modules accepted: Orders

## 2016-08-21 ENCOUNTER — Other Ambulatory Visit: Payer: Self-pay | Admitting: *Deleted

## 2016-08-21 DIAGNOSIS — I824Z9 Acute embolism and thrombosis of unspecified deep veins of unspecified distal lower extremity: Secondary | ICD-10-CM

## 2016-08-21 MED ORDER — RIVAROXABAN 20 MG PO TABS: 20.0000 mg | ORAL_TABLET | Freq: Every day | ORAL | 3 refills | Status: DC

## 2016-12-02 ENCOUNTER — Other Ambulatory Visit: Payer: Self-pay | Admitting: Vascular Surgery

## 2016-12-02 DIAGNOSIS — I824Z9 Acute embolism and thrombosis of unspecified deep veins of unspecified distal lower extremity: Secondary | ICD-10-CM

## 2016-12-02 MED ORDER — RIVAROXABAN 20 MG PO TABS: 20.0000 mg | ORAL_TABLET | Freq: Every day | ORAL | 3 refills | Status: DC

## 2016-12-03 ENCOUNTER — Other Ambulatory Visit: Payer: Self-pay | Admitting: Vascular Surgery

## 2016-12-03 DIAGNOSIS — I824Z9 Acute embolism and thrombosis of unspecified deep veins of unspecified distal lower extremity: Secondary | ICD-10-CM

## 2016-12-03 MED ORDER — RIVAROXABAN 20 MG PO TABS: 20.0000 mg | ORAL_TABLET | Freq: Every day | ORAL | 3 refills | Status: DC

## 2017-01-13 ENCOUNTER — Encounter: Payer: Self-pay | Admitting: Vascular Surgery

## 2017-01-16 ENCOUNTER — Encounter (HOSPITAL_COMMUNITY): Payer: BLUE CROSS/BLUE SHIELD

## 2017-01-20 ENCOUNTER — Ambulatory Visit (HOSPITAL_COMMUNITY)
Admission: RE | Admit: 2017-01-20 | Discharge: 2017-01-20 | Disposition: A | Discharge status: Home / Self Care | Payer: BLUE CROSS/BLUE SHIELD | Source: Ambulatory Visit | Attending: Vascular Surgery | Admitting: Vascular Surgery

## 2017-01-20 DIAGNOSIS — I872 Venous insufficiency (chronic) (peripheral): Secondary | ICD-10-CM | POA: Diagnosis not present

## 2017-01-20 DIAGNOSIS — I82412 Acute embolism and thrombosis of left femoral vein: Secondary | ICD-10-CM

## 2017-01-20 DIAGNOSIS — Z86718 Personal history of other venous thrombosis and embolism: Secondary | ICD-10-CM | POA: Insufficient documentation

## 2017-01-23 ENCOUNTER — Encounter: Payer: Self-pay | Admitting: Vascular Surgery

## 2017-01-23 ENCOUNTER — Ambulatory Visit (INDEPENDENT_AMBULATORY_CARE_PROVIDER_SITE_OTHER): Payer: BLUE CROSS/BLUE SHIELD | Admitting: Vascular Surgery

## 2017-01-23 VITALS — BP 120/82 | HR 98 | Temp 98.1°F | Resp 20 | Ht 72.0 in | Wt 225.0 lb

## 2017-01-23 DIAGNOSIS — I87009 Postthrombotic syndrome without complications of unspecified extremity: Secondary | ICD-10-CM | POA: Diagnosis not present

## 2017-01-23 NOTE — Progress Notes (Signed)
[facility] [place]  Patient name: Scott Quinn [place] MRN: [id number] DOB: 08/10/1960 Sex: male  REASON FOR VISIT: follow-up   HPI:    Scott Quinn Kimm is a 56 y.o. male who previously underwent mechanical thrombolysis of his left external iliac, femoral and popliteal veins for DVT with Dr. Darrick PennaFields on 07/19/16. He did not require any venous stenting. He was last seen in the office on 08/15/16. At that time, he still had some left leg swelling but it had improved. He also had some left heel numbness which was felt to be sensory neuropraxia.    Today, he states that his left leg swelling is about the same. His left heel numbness is better. He is currently on Xarelto and has not had any issues. He has been wearing his compression stockings only 2-3 times a week.  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   He is a current smoker. Has had previous episode of DVT left leg.   PAST MEDICAL HISTORY:   Past Medical History:  Diagnosis Date  . Costochondritis   . Diabetes mellitus without complication (HCC)   . DVT (deep venous thrombosis) (HCC)     No family history on file.  Social History  Substance Use Topics  . Smoking status: Current Every Day Smoker    Packs/day: 0.25    Years: 30.00    Types: Cigarettes  . Smokeless tobacco: Never Used     Comment: 1 pk per day  . Alcohol use 1.8 oz/week    3 Cans of beer per week     Comment: reported on 07/03/15    Allergies  Allergen Reactions  . Thimerosal Other (See Comments)    Redness     MEDICATIONS:   Current Outpatient Prescriptions  Medication Sig Dispense Refill  . rivaroxaban (XARELTO) 20 MG TABS tablet Take 1 tablet (20 mg total) by mouth daily with supper. Start after starter pack completed 30 tablet 3   No current facility-administered medications for this visit.     REVIEW OF SYSTEMS:   REVIEW OF SYSTEMS (negative unless checked):   Cardiac:  []  Chest pain or chest pressure? []   Shortness of breath upon activity? []  Shortness of breath when lying flat? []  Irregular heart rhythm?  Vascular:  []  Pain in calf, thigh, or hip brought on by walking? []  Pain in feet at night that wakes you up from your sleep? []  Blood clot in your veins? [x]  Leg swelling?  Pulmonary:  []  Oxygen at home? []  Productive cough? []  Wheezing?  Neurologic:  []  Sudden weakness in arms or legs? []  Sudden numbness in arms or legs? []  Sudden onset of difficult speaking or slurred speech? []  Temporary loss of vision in one eye? []  Problems with dizziness?  Gastrointestinal:  []  Blood in stool? []  Vomited blood?  Genitourinary:  []  Burning when urinating? []  Blood in urine?  Psychiatric:  []  Major depression  Hematologic:  []  Bleeding problems? []  Problems with blood clotting?  Dermatologic:  []  Rashes or ulcers?  Constitutional:  []  Fever or chills?  Ear/Nose/Throat:  []  Change in hearing? []  Nose bleeds? []  Sore throat?  Musculoskeletal:  []  Back pain? []  Joint pain? []  Muscle pain?  PHYSICAL EXAM:   Vitals:   01/23/17 1530  BP: 120/82  Pulse: 98  Resp: 20  Temp: 98.1 F (36.7 C)  TempSrc: Oral  SpO2: 91%  Weight: 225 lb (102.1 kg)  Height: 6' (1.829  m)    GENERAL: The patient is a well-nourished male, in no acute distress. The vital signs are documented above. HEENT: normocephalic, atraumatic. No abnormalities noted.  VASCULAR: 2+ left femoral, left DP and PT pulses. Left leg is visibly more swollen than right. Some hemosiderin staining left gaiter area with diffuse varicosities at left foot and ankle. No open wounds or sores seen.  PULMONARY: Non labored respiratory effort. .  MUSCULOSKELETAL: Left leg larger than right.  NEUROLOGIC: No focal deficits.  SKIN: There are no ulcers or rashes noted. PSYCHIATRIC: The patient has a normal affect.  DATA:    IVC/Left iliac duplex 01/20/17 IVC and left iliac veins are patent without thrombus  Left lower  extremity venous duplex and reflux exam 01/20/17 Deep and great saphenous venous reflux present. No evidence of thrombus in deep or superficial veins.   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 Left leg swelling has been the same. Has only been wearing compression 2-3 times a week. Discussed the importance of wearing compression stockings daily and lifelong. He will continue on Xarelto for now. Follow up in 6 months with repeat venous duplex. May potentially discuss laser ablation of his left great saphenous vein at that time.   Maris Berger, PA-C [facility] MD: Fields  History and exam findings as above. The patient still has some swelling in the left lower extremity suggestive of postphlebitic syndrome. He will try to him more compliant with compression stockings. His DVT was a proximally 5 months ago. His duplex today shows no residual DVT. He did have reflux in the deep and superficial systems today. I believe the best option is to keep him on his anticoagulation for an additional 6 months. He will then return for follow-up and consideration for ablation of his superficial system if he still has persistent swelling the left leg. At that time I think we can also discontinue his anticoagulation if he has no further events.  Fabienne Bruns, MD [facility] [place] Office: [phone] Pager: [phone]

## 2017-02-24 ENCOUNTER — Emergency Department
Admission: EM | Admit: 2017-02-24 | Discharge: 2017-02-24 | Disposition: A | Discharge status: Home / Self Care | Payer: BLUE CROSS/BLUE SHIELD | Attending: Emergency Medicine | Admitting: Emergency Medicine

## 2017-02-24 ENCOUNTER — Emergency Department: Payer: BLUE CROSS/BLUE SHIELD

## 2017-02-24 DIAGNOSIS — Z79899 Other long term (current) drug therapy: Secondary | ICD-10-CM | POA: Diagnosis not present

## 2017-02-24 DIAGNOSIS — E119 Type 2 diabetes mellitus without complications: Secondary | ICD-10-CM | POA: Diagnosis not present

## 2017-02-24 DIAGNOSIS — R109 Unspecified abdominal pain: Secondary | ICD-10-CM | POA: Diagnosis present

## 2017-02-24 DIAGNOSIS — Z7901 Long term (current) use of anticoagulants: Secondary | ICD-10-CM | POA: Diagnosis not present

## 2017-02-24 DIAGNOSIS — F17210 Nicotine dependence, cigarettes, uncomplicated: Secondary | ICD-10-CM | POA: Diagnosis not present

## 2017-02-24 DIAGNOSIS — K8020 Calculus of gallbladder without cholecystitis without obstruction: Secondary | ICD-10-CM | POA: Insufficient documentation

## 2017-02-24 DIAGNOSIS — R1011 Right upper quadrant pain: Secondary | ICD-10-CM | POA: Diagnosis not present

## 2017-02-24 LAB — COMPREHENSIVE METABOLIC PANEL
ALT: 35 U/L (ref 17–63)
ANION GAP: 10 (ref 5–15)
AST: 47 U/L — AB (ref 15–41)
Albumin: 4.3 g/dL (ref 3.5–5.0)
Alkaline Phosphatase: 55 U/L (ref 38–126)
BUN: 13 mg/dL (ref 6–20)
CHLORIDE: 103 mmol/L (ref 101–111)
CO2: 26 mmol/L (ref 22–32)
Calcium: 9.4 mg/dL (ref 8.9–10.3)
Creatinine, Ser: 0.74 mg/dL (ref 0.61–1.24)
GFR calc Af Amer: >60 mL/min (ref >60)
Glucose, Bld: 108 mg/dL — ABNORMAL HIGH (ref 65–99)
POTASSIUM: 3.9 mmol/L (ref 3.5–5.1)
Sodium: 139 mmol/L (ref 135–145)
TOTAL PROTEIN: 7.4 g/dL (ref 6.5–8.1)
Total Bilirubin: 1.1 mg/dL (ref 0.3–1.2)

## 2017-02-24 LAB — CBC
HCT: 52.7 % — ABNORMAL HIGH (ref 40.0–52.0)
HEMOGLOBIN: 18.4 g/dL — AB (ref 13.0–18.0)
MCH: 33 pg (ref 26.0–34.0)
MCHC: 34.9 g/dL (ref 32.0–36.0)
MCV: 94.7 fL (ref 80.0–100.0)
Platelets: 134 10*3/uL — ABNORMAL LOW (ref 150–440)
RBC: 5.56 MIL/uL (ref 4.40–5.90)
RDW: 12.9 % (ref 11.5–14.5)
WBC: 6.5 10*3/uL (ref 3.8–10.6)

## 2017-02-24 LAB — URINALYSIS, COMPLETE (UACMP) WITH MICROSCOPIC
BACTERIA UA: NONE SEEN
BILIRUBIN URINE: NEGATIVE
Glucose, UA: NEGATIVE mg/dL
HGB URINE DIPSTICK: NEGATIVE
KETONES UR: NEGATIVE mg/dL
LEUKOCYTES UA: NEGATIVE
NITRITE: NEGATIVE
PH: 6 (ref 5.0–8.0)
Protein, ur: 30 mg/dL — AB
Specific Gravity, Urine: 1.024 (ref 1.005–1.030)
WBC, UA: NONE SEEN WBC/hpf (ref 0–5)

## 2017-02-24 LAB — LIPASE, BLOOD: LIPASE: 32 U/L (ref 11–51)

## 2017-02-24 MED ORDER — TRAMADOL HCL 50 MG PO TABS: 50.0000 mg | ORAL_TABLET | Freq: Four times a day (QID) | ORAL | 0 refills | Status: DC | PRN

## 2017-02-24 MED ORDER — KETOROLAC TROMETHAMINE 60 MG/2ML IM SOLN
60.0000 mg | Freq: Once | INTRAMUSCULAR | Status: AC
  Administered 2017-02-24: 60 mg via INTRAMUSCULAR
  Filled 2017-02-24: qty 2

## 2017-02-24 MED ORDER — ONDANSETRON 4 MG PO TBDP: 4.0000 mg | ORAL_TABLET | Freq: Three times a day (TID) | ORAL | 0 refills | Status: DC | PRN

## 2017-02-24 NOTE — ED Notes (Signed)
Patient transported to Ultrasound 

## 2017-02-24 NOTE — ED Notes (Signed)
ED Provider at bedside. 

## 2017-02-24 NOTE — ED Provider Notes (Signed)
[facility] Emergency Department Provider Note   ____________________________________________   First MD Initiated Contact with Patient 02/24/17 [phone]     (approximate)  I have reviewed the triage vital signs and the nursing notes.   HISTORY  Chief Complaint Abdominal Pain    HPI Scott Quinn is a 56 y.o. male who comes into the hospital today with some right-sided abdominal pain. He reports it started yesterday afternoon. The patient has a history of costochondritis and reports it feels similar but he is also having pain going into his back and it worries him. The patient states that he takes Xarelto to thin his blood and has had a history of DVT in his left leg. The patient reports that he didn't take anything for pain. He does have a mild cough but he is a smoker. The patient denies any nausea or vomiting no lightheadedness or dizziness and he said no hematuria. At its worst the pain is 8-9 out of 10 in intensity. He reports currently is a 3-4 out of 10. He reports it is dull. He did recently quit drinking. He wasn't sure if it was his liver something else. The patient is here today for evaluation.   Past Medical History:  Diagnosis Date  . Costochondritis   . Diabetes mellitus without complication (HCC)   . DVT (deep venous thrombosis) [facility](HCC)     Patient Active Problem List   Diagnosis Date Noted  . Type 2 diabetes mellitus (HCC) 07/03/2015  . Alcohol abuse 06/13/2015  . Colon cancer screening 03/03/2014  . Encounter for therapeutic drug monitoring 10/28/2013  . DVT (deep venous thrombosis) (HCC) 10/07/2013  . DVT of lower limb, acute (HCC) 09/22/2013  . Tobacco use 09/22/2013    Past Surgical History:  Procedure Laterality Date  . BACK SURGERY    . CARDIAC CATHETERIZATION N/A 07/19/2016   Procedure: Intravascular Ultrasound/IVUS;  Surgeon: Sherren Kernsharles E Fields, MD;  [facility];  Service: Cardiovascular;  Laterality: N/A;  .  INNER EAR SURGERY    . JOINT REPLACEMENT     knee surgery  . NASAL SEPTUM SURGERY    . PERIPHERAL VASCULAR CATHETERIZATION Left 07/19/2016   Procedure: Lower Extremity Venography;  Surgeon: Sherren Kernsharles E Fields, MD;  [facility];  Service: Cardiovascular;  Laterality: Left;  . PERIPHERAL VASCULAR CATHETERIZATION  07/19/2016   Procedure: Thrombectomy;  Surgeon: Sherren Kernsharles E Fields, MD;  [facility];  Service: Cardiovascular;;  left venous system     Prior to Admission medications   Medication Sig Start Date End Date Taking? Authorizing Provider  amoxicillin (AMOXIL) 500 MG capsule Take 500 mg by mouth 3 (three) times daily. 02/17/17 02/24/17 Yes [provider]  rivaroxaban (XARELTO) 20 MG TABS tablet Take 1 tablet (20 mg total) by mouth daily with supper. Start after starter pack completed 12/03/16  Yes Sherren KernsFields, Charles E, MD  ondansetron (ZOFRAN ODT) 4 MG disintegrating tablet Take 1 tablet (4 mg total) by mouth every 8 (eight) hours as needed for nausea or vomiting. 02/24/17   Rebecka ApleyWebster, Allison P, MD  traMADol (ULTRAM) 50 MG tablet Take 1 tablet (50 mg total) by mouth every 6 (six) hours as needed. 02/24/17   Rebecka ApleyWebster, Allison P, MD    Allergies Thimerosal  No family history on file.  Social History Social History  Substance Use Topics  . Smoking status: Current Every Day Smoker    Packs/day: 0.25    Years: 30.00  Types: Cigarettes  . Smokeless tobacco: Never Used     Comment: 1 pk per day  . Alcohol use 1.8 oz/week    3 Cans of beer per week     Comment: reported on 07/03/15    Review of Systems  Constitutional: No fever/chills Eyes: No visual changes. ENT: No sore throat. Cardiovascular: Denies chest pain. Respiratory: Denies shortness of breath. Gastrointestinal:  abdominal pain.  No nausea, no vomiting.  No diarrhea.  No constipation. Genitourinary: Negative for dysuria. Musculoskeletal: Negative for back pain. Skin: Negative for  rash. Neurological: Negative for headaches, focal weakness or numbness.   ____________________________________________   PHYSICAL EXAM:  VITAL SIGNS: ED Triage Vitals  Enc Vitals Group     BP 02/24/17 0125 126/78     Pulse Rate 02/24/17 0125 95     Resp 02/24/17 0125 18     Temp 02/24/17 0125 99 F (37.2 C)     Temp Source 02/24/17 0125 Oral     SpO2 02/24/17 0125 96 %     Weight 02/24/17 0102 225 lb (102.1 kg)     Height 02/24/17 0102 6' (1.829 m)     Head Circumference --      Peak Flow --      Pain Score 02/24/17 0101 6     Pain Loc --      Pain Edu? --      Excl. in GC? --     Constitutional: Alert and oriented. Well appearing and in mild distress. Eyes: Conjunctivae are normal. PERRL. EOMI. Head: Atraumatic. Nose: No congestion/rhinnorhea. Mouth/Throat: Mucous membranes are moist.  Oropharynx non-erythematous. Cardiovascular: Normal rate, regular rhythm. Grossly normal heart sounds.  Good peripheral circulation. Respiratory: Normal respiratory effort.  No retractions. Lungs CTAB. Gastrointestinal: Soft and nontender. No distention. Positive bowel sounds Musculoskeletal: No lower extremity tenderness nor edema.   Neurologic:  Normal speech and language.  Skin:  Skin is warm, dry and intact.  Psychiatric: Mood and affect are normal.   ____________________________________________   LABS (all labs ordered are listed, but only abnormal results are displayed)  Labs Reviewed  COMPREHENSIVE METABOLIC PANEL - Abnormal; Notable for the following:       Result Value   Glucose, Bld 108 (*)    AST 47 (*)    All other components within normal limits  CBC - Abnormal; Notable for the following:    Hemoglobin 18.4 (*)    HCT 52.7 (*)    Platelets 134 (*)    All other components within normal limits  LIPASE, BLOOD  URINALYSIS, COMPLETE (UACMP) WITH MICROSCOPIC   ____________________________________________  EKG  ED ECG REPORT I, Rebecka Apley, the attending  physician, personally viewed and interpreted this ECG.   Date: 02/24/2017  EKG Time: 114  Rate: 97  Rhythm: normal sinus rhythm  Axis: normal  Intervals:none  ST&T Change: none  ____________________________________________  RADIOLOGY  US Abdomen Limited Ruq  Result Date: 02/24/2017 CLINICAL DATA:  56 y/o M; 2 days of right upper quadrant abdominal pain. EXAM: ULTRASOUND ABDOMEN LIMITED RIGHT UPPER QUADRANT COMPARISON:  None. FINDINGS: Gallbladder: Multiple echogenic foci with distal acoustic shadowing compatible with gallstones measuring up to 9 mm. No gallbladder wall thickening or pericholecystic fluid. Negative sonographic Murphy sign. Common bile duct: Diameter: 5.3 mm Liver: No focal liver lesion identified. Diffusely increased liver echogenicity compatible with steatosis. IMPRESSION: 1. Gallstones. 2. Hepatic steatosis. 3. No acute process identified. Electronically Signed   By: Mitzi Hansen M.D.   On: 02/24/2017  05:18    ____________________________________________   PROCEDURES  Procedure(s) performed: None  Procedures  Critical Care performed: No  ____________________________________________   INITIAL IMPRESSION / ASSESSMENT AND PLAN / ED COURSE  Pertinent labs & imaging results that were available during my care of the patient were reviewed by me and considered in my medical decision making (see chart for details).  This is a 56 year old male who comes into the hospital today with some right-sided abdominal pain. He is holding his side in saying that the pain is in his abdomen and into his back. I will send the patient for right upper quadrant also looking at his gallbladder. The patient's blood work is unremarkable. I will give him a shot of Toradol. I will reassess the patient once I received his ultrasound results.     The patient appears to have gallstones. He is comfortable at the moment. He will be discharged to follow up with the surgeon. He  should return with any pain. ____________________________________________   FINAL CLINICAL IMPRESSION(S) / ED DIAGNOSES  Final diagnoses:  Abdominal pain  Right upper quadrant abdominal pain  Gallstones      NEW MEDICATIONS STARTED DURING THIS VISIT:  New Prescriptions   ONDANSETRON (ZOFRAN ODT) 4 MG DISINTEGRATING TABLET    Take 1 tablet (4 mg total) by mouth every 8 (eight) hours as needed for nausea or vomiting.   TRAMADOL (ULTRAM) 50 MG TABLET    Take 1 tablet (50 mg total) by mouth every 6 (six) hours as needed.     Note:  This document was prepared using Dragon voice recognition software and may include unintentional dictation errors.    Rebecka Apley, MD 02/24/17 [phone]

## 2017-02-24 NOTE — Discharge Instructions (Signed)
PLease follow up with surgery for further evaluation of your abdominal pain.

## 2017-02-24 NOTE — ED Triage Notes (Signed)
Patient recently stopped drinking alcohol about a week or so ago. Presents tonight with right upper quadrant pain. Denies N/V. Appetite remains good. Drinking fluids well. Able to speak in complete sentences without complaints of SHOB.

## 2017-02-25 ENCOUNTER — Encounter: Payer: Self-pay | Admitting: Surgery

## 2017-02-25 ENCOUNTER — Ambulatory Visit (INDEPENDENT_AMBULATORY_CARE_PROVIDER_SITE_OTHER): Payer: BLUE CROSS/BLUE SHIELD | Admitting: Surgery

## 2017-02-25 VITALS — BP 137/78 | HR 80 | Temp 98.2°F | Ht 72.0 in | Wt 221.5 lb

## 2017-02-25 DIAGNOSIS — K8050 Calculus of bile duct without cholangitis or cholecystitis without obstruction: Secondary | ICD-10-CM | POA: Diagnosis not present

## 2017-02-25 NOTE — Patient Instructions (Signed)
Cholelithiasis Cholelithiasis is also called "gallstones." It is a kind of gallbladder disease. The gallbladder is an organ that stores a liquid (bile) that helps you digest fat. Gallstones may not cause symptoms (may be silent gallstones) until they cause a blockage, and then they can cause pain (gallbladder attack). Follow these instructions at home:  Take over-the-counter and prescription medicines only as told by your doctor.  Stay at a healthy weight.  Eat healthy foods. This includes: ? Eating fewer fatty foods, like fried foods. ? Eating fewer refined carbs (refined carbohydrates). Refined carbs are breads and grains that are highly processed, like white bread and white rice. Instead, choose whole grains like whole-wheat bread and brown rice. ? Eating more fiber. Almonds, fresh fruit, and beans are healthy sources of fiber.  Keep all follow-up visits as told by your doctor. This is important. Contact a doctor if:  You have sudden pain in the upper right side of your belly (abdomen). Pain might spread to your right shoulder or your chest. This may be a sign of a gallbladder attack.  You feel sick to your stomach (are nauseous).  You throw up (vomit).  You have been diagnosed with gallstones that have no symptoms and you get: ? Belly pain. ? Discomfort, burning, or fullness in the upper part of your belly (indigestion). Get help right away if:  You have sudden pain in the upper right side of your belly, and it lasts for more than 2 hours.  You have belly pain that lasts for more than 5 hours.  You have a fever or chills.  You keep feeling sick to your stomach or you keep throwing up.  Your skin or the whites of your eyes turn yellow (jaundice).  You have dark-colored pee (urine).  You have light-colored poop (stool). Summary  Cholelithiasis is also called "gallstones."  The gallbladder is an organ that stores a liquid (bile) that helps you digest fat.  Silent  gallstones are gallstones that do not cause symptoms.  A gallbladder attack may cause sudden pain in the upper right side of your belly. Pain might spread to your right shoulder or your chest. If this happens, contact your doctor.  If you have sudden pain in the upper right side of your belly that lasts for more than 2 hours, get help right away. This information is not intended to replace advice given to you by your health care provider. Make sure you discuss any questions you have with your health care provider. Document Released: 12/18/2007 Document Revised: 03/17/2016 Document Reviewed: 03/17/2016 Elsevier Interactive Patient Education  2017 Elsevier Inc[employer]  

## 2017-02-25 NOTE — Progress Notes (Signed)
Surgical Consultation  02/25/2017  Scott Quinn is an 56 y.o. male.   Referring Physician: Emergency room physician  CC: Right upper quadrant pain  HPI: This a patient with a personal history of a DVT who is in the emergency room recently with right upper quadrant pain and a workup that showed gallstones. Patient had a single severe episode of right upper quadrant pain that lasted several hours with some nausea but no emesis. He has had frequent minor attacks which he was concerned her back pain attacks but they were in the right side and radiated around to the right upper quadrant. He's had no jaundice or acholic stools.  He continues to smoke in spite of his multiple medical problems including diabetes and a history of DVT and cardiac catheterization. 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 Costochondritis   . Diabetes mellitus without complication ([place])   . DVT (deep venous thrombosis) ([place])     Past Surgical History:  Procedure Laterality Date  . BACK SURGERY    . CARDIAC CATHETERIZATION N/A 07/19/2016   Procedure: Intravascular Ultrasound/IVUS;  Surgeon: Elam Dutch, MD;  Location: [place] CV LAB;  Service: Cardiovascular;  Laterality: N/A;  . INNER EAR SURGERY    . JOINT REPLACEMENT     knee surgery  . NASAL SEPTUM SURGERY    . PERIPHERAL VASCULAR CATHETERIZATION Left 07/19/2016   Procedure: Lower Extremity Venography;  Surgeon: Elam Dutch, MD;  Location: [place] CV LAB;  Service: Cardiovascular;  Laterality: Left;  . PERIPHERAL VASCULAR CATHETERIZATION  07/19/2016   Procedure: Thrombectomy;  Surgeon: Elam Dutch, MD;  Location: [place] CV LAB;  Service: Cardiovascular;;  left venous system     No family history on file. No FH of GB disease  Social History:  reports that he has been smoking Cigarettes.  He has a 7.50 pack-year smoking history. He has never used smokeless  tobacco. He reports that he drinks about 1.8 oz of alcohol per week . He reports that he does not use drugs.  Allergies:  Allergies  Allergen Reactions  . Thimerosal Other (See Comments)    Redness     Medications reviewed.   Review of Systems:   Review of Systems  Constitutional: Negative for chills and fever.  HENT: Negative.   Eyes: Negative.   Respiratory: Negative.   Cardiovascular: Negative for chest pain and palpitations.  Gastrointestinal: Positive for abdominal pain. Negative for constipation, diarrhea, heartburn, nausea and vomiting.  Genitourinary: Negative.   Musculoskeletal: Negative.   Skin: Negative.   Neurological: Negative.   Endo/Heme/Allergies: Negative.   Psychiatric/Behavioral: Negative.      Physical Exam:  There were no vitals taken for this visit.  Physical Exam  Constitutional: He is oriented to person, place, and time and well-developed, well-nourished, and in no distress. No distress.  HENT:  Head: Normocephalic and atraumatic.  Eyes: Pupils are equal, round, and reactive to light. Right eye exhibits no discharge. Left eye exhibits no discharge. No scleral icterus.  Neck: Normal range of motion. No JVD present.  Cardiovascular: Normal rate, regular rhythm and normal heart sounds.   Pulmonary/Chest: Effort normal and breath sounds normal. No respiratory distress. He has no wheezes. He has no rales.  Abdominal: Soft. He exhibits no distension. There is no tenderness. There is no rebound and no guarding.  Supraumbilical scar from prior colon surgery Soft nontender abdomen  negative Murphy sign  Musculoskeletal: Normal range of motion. He exhibits edema.  Left lower extremity scars  Lymphadenopathy:    He has no cervical adenopathy.  Neurological: He is alert and oriented to person, place, and time.  Skin: Skin is warm and dry. No rash noted. He is not diaphoretic. No erythema.  Psychiatric: Mood and affect normal.  Vitals  reviewed.     Results for orders placed or performed during the hospital encounter of 02/24/17 (from the past 48 hour(s))  Lipase, blood     Status: None   Collection Time: 02/24/17  1:19 AM  Result Value Ref Range   Lipase 32 11 - 51 U/L  Comprehensive metabolic panel     Status: Abnormal   Collection Time: 02/24/17  1:19 AM  Result Value Ref Range   Sodium 139 135 - 145 mmol/L   Potassium 3.9 3.5 - 5.1 mmol/L   Chloride 103 101 - 111 mmol/L   CO2 26 22 - 32 mmol/L   Glucose, Bld 108 (H) 65 - 99 mg/dL   BUN 13 6 - 20 mg/dL   Creatinine, Ser 0.74 0.61 - 1.24 mg/dL   Calcium 9.4 8.9 - 10.3 mg/dL   Total Protein 7.4 6.5 - 8.1 g/dL   Albumin 4.3 3.5 - 5.0 g/dL   AST 47 (H) 15 - 41 U/L   ALT 35 17 - 63 U/L   Alkaline Phosphatase 55 38 - 126 U/L   Total Bilirubin 1.1 0.3 - 1.2 mg/dL   GFR calc non Af Amer >60 >60 mL/min   GFR calc Af Amer >60 >60 mL/min    Comment: (NOTE) The eGFR has been calculated using the CKD EPI equation. This calculation has not been validated in all clinical situations. eGFR's persistently <60 mL/min signify possible Chronic Kidney Disease.    Anion gap 10 5 - 15  CBC     Status: Abnormal   Collection Time: 02/24/17  1:19 AM  Result Value Ref Range   WBC 6.5 3.8 - 10.6 K/uL   RBC 5.56 4.40 - 5.90 MIL/uL   Hemoglobin 18.4 (H) 13.0 - 18.0 g/dL   HCT 52.7 (H) 40.0 - 52.0 %   MCV 94.7 80.0 - 100.0 fL   MCH 33.0 26.0 - 34.0 pg   MCHC 34.9 32.0 - 36.0 g/dL   RDW 12.9 11.5 - 14.5 %   Platelets 134 (L) 150 - 440 K/uL  Urinalysis, Complete w Microscopic     Status: Abnormal   Collection Time: 02/24/17  5:57 AM  Result Value Ref Range   Color, Urine AMBER (A) YELLOW    Comment: BIOCHEMICALS MAY BE AFFECTED BY COLOR   APPearance CLEAR (A) CLEAR   Specific Gravity, Urine 1.024 1.005 - 1.030   pH 6.0 5.0 - 8.0   Glucose, UA NEGATIVE NEGATIVE mg/dL   Hgb urine dipstick NEGATIVE NEGATIVE   Bilirubin Urine NEGATIVE NEGATIVE   Ketones, ur NEGATIVE  NEGATIVE mg/dL   Protein, ur 30 (A) NEGATIVE mg/dL   Nitrite NEGATIVE NEGATIVE   Leukocytes, UA NEGATIVE NEGATIVE   RBC / HPF 0-5 0 - 5 RBC/hpf   WBC, UA NONE SEEN 0 - 5 WBC/hpf   Bacteria, UA NONE SEEN NONE SEEN   Squamous Epithelial / LPF 0-5 (A) NONE SEEN   Mucous PRESENT    US Abdomen Limited Ruq  Result Date: 02/24/2017 CLINICAL DATA:  56 y/o M; 2 days of right upper quadrant abdominal pain. EXAM: ULTRASOUND ABDOMEN LIMITED RIGHT UPPER QUADRANT  COMPARISON:  None. FINDINGS: Gallbladder: Multiple echogenic foci with distal acoustic shadowing compatible with gallstones measuring up to 9 mm. No gallbladder wall thickening or pericholecystic fluid. Negative sonographic Murphy sign. Common bile duct: Diameter: 5.3 mm Liver: No focal liver lesion identified. Diffusely increased liver echogenicity compatible with steatosis. IMPRESSION: 1. Gallstones. 2. Hepatic steatosis. 3. No acute process identified. Electronically Signed   By: Kristine Garbe M.D.   On: 02/24/2017 05:18    Assessment/Plan:  LFTs are normal ultrasound shows stones without acute cholecystitis. Patient's symptoms right now are minimal but he has had fairly severe attacks in the past. Currently he would be an elective candidate for surgery but he is on Xarelto with a tremendous smoking and drinking history as well as a DVT in the last 8 months. I spoke with him at length about smoking cessation to diminish his risk and also time from his initial event of the DVT. And also stopping the Xarelto at some point to decrease the risk of bleeding. He agreed to attempt to stop smoking and I will see him back in 3 or 4 weeks to discuss this. Should he have further attacks that would require emergent surgery then going to the emergency room for severe unrelenting pain with nausea and vomiting may necessitate urgent surgery as stopping of the Xarelto with the understanding that he is at significant risk for a DVT PE. Multiple questions  were answered for him he understood and agreed to proceed. Over 40 minutes were spent in direct patient care.    Florene Glen, MD, FACS

## 2017-03-10 ENCOUNTER — Emergency Department
Admission: EM | Admit: 2017-03-10 | Discharge: 2017-03-10 | Disposition: A | Discharge status: Home / Self Care | Payer: BLUE CROSS/BLUE SHIELD | Attending: Emergency Medicine | Admitting: Emergency Medicine

## 2017-03-10 ENCOUNTER — Encounter: Payer: Self-pay | Admitting: Emergency Medicine

## 2017-03-10 ENCOUNTER — Emergency Department: Payer: BLUE CROSS/BLUE SHIELD

## 2017-03-10 DIAGNOSIS — T782XXA Anaphylactic shock, unspecified, initial encounter: Secondary | ICD-10-CM | POA: Insufficient documentation

## 2017-03-10 DIAGNOSIS — T7840XA Allergy, unspecified, initial encounter: Secondary | ICD-10-CM | POA: Insufficient documentation

## 2017-03-10 DIAGNOSIS — R062 Wheezing: Secondary | ICD-10-CM | POA: Insufficient documentation

## 2017-03-10 DIAGNOSIS — T450X5A Adverse effect of antiallergic and antiemetic drugs, initial encounter: Secondary | ICD-10-CM | POA: Insufficient documentation

## 2017-03-10 DIAGNOSIS — F17210 Nicotine dependence, cigarettes, uncomplicated: Secondary | ICD-10-CM | POA: Diagnosis not present

## 2017-03-10 DIAGNOSIS — Z79899 Other long term (current) drug therapy: Secondary | ICD-10-CM | POA: Diagnosis not present

## 2017-03-10 DIAGNOSIS — E119 Type 2 diabetes mellitus without complications: Secondary | ICD-10-CM | POA: Insufficient documentation

## 2017-03-10 DIAGNOSIS — J9801 Acute bronchospasm: Secondary | ICD-10-CM | POA: Diagnosis not present

## 2017-03-10 DIAGNOSIS — R21 Rash and other nonspecific skin eruption: Secondary | ICD-10-CM | POA: Diagnosis present

## 2017-03-10 LAB — COMPREHENSIVE METABOLIC PANEL
ALBUMIN: 4.1 g/dL (ref 3.5–5.0)
ALK PHOS: 57 U/L (ref 38–126)
ALT: 40 U/L (ref 17–63)
ANION GAP: 15 (ref 5–15)
AST: 53 U/L — ABNORMAL HIGH (ref 15–41)
BILIRUBIN TOTAL: 0.6 mg/dL (ref 0.3–1.2)
BUN: 10 mg/dL (ref 6–20)
CALCIUM: 9.1 mg/dL (ref 8.9–10.3)
CO2: 26 mmol/L (ref 22–32)
Chloride: 102 mmol/L (ref 101–111)
Creatinine, Ser: 0.9 mg/dL (ref 0.61–1.24)
GFR calc Af Amer: >60 mL/min (ref >60)
GFR calc non Af Amer: >60 mL/min (ref >60)
GLUCOSE: 146 mg/dL — AB (ref 65–99)
Potassium: 3.8 mmol/L (ref 3.5–5.1)
Sodium: 143 mmol/L (ref 135–145)
TOTAL PROTEIN: 7.2 g/dL (ref 6.5–8.1)

## 2017-03-10 LAB — CBC
HEMATOCRIT: 59.7 % — AB (ref 40.0–52.0)
HEMOGLOBIN: 20.6 g/dL — AB (ref 13.0–18.0)
MCH: 32.9 pg (ref 26.0–34.0)
MCHC: 34.4 g/dL (ref 32.0–36.0)
MCV: 95.6 fL (ref 80.0–100.0)
Platelets: 258 10*3/uL (ref 150–440)
RBC: 6.24 MIL/uL — ABNORMAL HIGH (ref 4.40–5.90)
RDW: 13.2 % (ref 11.5–14.5)
WBC: 8.3 10*3/uL (ref 3.8–10.6)

## 2017-03-10 MED ORDER — EPINEPHRINE 0.3 MG/0.3ML IJ SOAJ
INTRAMUSCULAR | Status: AC
  Administered 2017-03-10: 0.3 mg via INTRAMUSCULAR
  Filled 2017-03-10: qty 0.3

## 2017-03-10 MED ORDER — SODIUM CHLORIDE 0.9 % IV SOLN
1000.0000 mL | Freq: Once | INTRAVENOUS | Status: AC
  Administered 2017-03-10: 1000 mL via INTRAVENOUS

## 2017-03-10 MED ORDER — PREDNISONE 50 MG PO TABS: 50.0000 mg | ORAL_TABLET | Freq: Every day | ORAL | 0 refills | Status: DC

## 2017-03-10 MED ORDER — DIPHENHYDRAMINE HCL 50 MG/ML IJ SOLN
25.0000 mg | Freq: Once | INTRAMUSCULAR | Status: AC
  Administered 2017-03-10: 25 mg via INTRAVENOUS
  Filled 2017-03-10: qty 1

## 2017-03-10 MED ORDER — IPRATROPIUM-ALBUTEROL 0.5-2.5 (3) MG/3ML IN SOLN
3.0000 mL | Freq: Once | RESPIRATORY_TRACT | Status: AC
  Administered 2017-03-10: 3 mL via RESPIRATORY_TRACT
  Filled 2017-03-10: qty 3

## 2017-03-10 MED ORDER — FAMOTIDINE IN NACL 20-0.9 MG/50ML-% IV SOLN
20.0000 mg | Freq: Once | INTRAVENOUS | Status: AC
  Administered 2017-03-10: 20 mg via INTRAVENOUS
  Filled 2017-03-10: qty 50

## 2017-03-10 MED ORDER — EPINEPHRINE 0.3 MG/0.3ML IJ SOAJ
0.3000 mg | Freq: Once | INTRAMUSCULAR | Status: AC
  Administered 2017-03-10: 0.3 mg via INTRAMUSCULAR

## 2017-03-10 MED ORDER — METHYLPREDNISOLONE SODIUM SUCC 125 MG IJ SOLR
125.0000 mg | Freq: Once | INTRAMUSCULAR | Status: AC
  Administered 2017-03-10: 125 mg via INTRAVENOUS
  Filled 2017-03-10: qty 2

## 2017-03-10 MED ORDER — ALBUTEROL SULFATE HFA 108 (90 BASE) MCG/ACT IN AERS: 2.0000 | INHALATION_SPRAY | Freq: Four times a day (QID) | RESPIRATORY_TRACT | 2 refills | Status: DC | PRN

## 2017-03-10 NOTE — Discharge Instructions (Signed)
Your hgb was elevated today, please discuss this with your doctor as we discussed. Take the prednisone for 5 days. Use the inhaler as needed

## 2017-03-10 NOTE — ED Triage Notes (Signed)
Pt in via POV with complaints of hives which started about an hour ago per pt report.  Pt reports only new medication he has been taking is Zofran, states he was in here a week ago with gallstones, has been taking Zofran as needed, states, "I have taken probably 4-5 of those pills."  Pt A/Ox4, saturation 83% on room air, pt reports some SOB, pt able to speak in clear, full sentences, pt color appears dusky.  MD notified and directly to bedside upon arrival.  Pt placed on 4L nasal cannula.  See MAR for medications given.

## 2017-03-10 NOTE — ED Provider Notes (Signed)
[facility] Emergency Department Provider Note   ____________________________________________    I have reviewed the triage vital signs and the nursing notes.   HISTORY  Chief Complaint Allergic Reaction     HPI Scott Quinn is a 56 y.o. male With a history of diabetes who presents with rash. Patient reports he developed rapid onset of itching followed by hives primarily to his legs and abdomen He also reports swelling sensation in his throat, very mild shortness of breath. He has never had this before. The only newmedication is Zofran. No abdominal pain. No vomiting. He has not taken anything for this   Past Medical History:  Diagnosis Date  . Costochondritis   . Diabetes mellitus without complication (HCC)   . DVT (deep venous thrombosis) [facility])     Patient Active Problem List   Diagnosis Date Noted  . Type 2 diabetes mellitus (HCC) 07/03/2015  . Alcohol abuse 06/13/2015  . Colon cancer screening 03/03/2014  . Encounter for therapeutic drug monitoring 10/28/2013  . DVT (deep venous thrombosis) (HCC) 10/07/2013  . DVT of lower limb, acute (HCC) 09/22/2013  . Tobacco use 09/22/2013    Past Surgical History:  Procedure Laterality Date  . BACK SURGERY    . CARDIAC CATHETERIZATION N/A 07/19/2016   Procedure: Intravascular Ultrasound/IVUS;  Surgeon: Sherren Kerns, MD;  Location: [facility] INVASIVE CV LAB;  Service: Cardiovascular;  Laterality: N/A;  . INNER EAR SURGERY    . JOINT REPLACEMENT     knee surgery  . NASAL SEPTUM SURGERY    . PERIPHERAL VASCULAR CATHETERIZATION Left 07/19/2016   Procedure: Lower Extremity Venography;  Surgeon: Sherren Kerns, MD;  Location: [facility] INVASIVE CV LAB;  Service: Cardiovascular;  Laterality: Left;  . PERIPHERAL VASCULAR CATHETERIZATION  07/19/2016   Procedure: Thrombectomy;  Surgeon: Sherren Kerns, MD;  Location: [facility] INVASIVE CV LAB;  Service: Cardiovascular;;  left venous system     Prior to Admission  medications   Medication Sig Start Date End Date Taking? Authorizing Provider  rivaroxaban (XARELTO) 20 MG TABS tablet Take 1 tablet (20 mg total) by mouth daily with supper. Start after starter pack completed 12/03/16  Yes Fields, Janetta Hora, MD  albuterol (PROVENTIL HFA;VENTOLIN HFA) 108 (90 Base) MCG/ACT inhaler Inhale 2 puffs into the lungs every 6 (six) hours as needed for wheezing or shortness of breath. 03/10/17   Jene Every, MD  ondansetron (ZOFRAN ODT) 4 MG disintegrating tablet Take 1 tablet (4 mg total) by mouth every 8 (eight) hours as needed for nausea or vomiting. 02/24/17   Rebecka Apley, MD  predniSONE (DELTASONE) 50 MG tablet Take 1 tablet (50 mg total) by mouth daily with breakfast. 03/10/17   Jene Every, MD  traMADol (ULTRAM) 50 MG tablet Take 1 tablet (50 mg total) by mouth every 6 (six) hours as needed. 02/24/17   Rebecka Apley, MD     Allergies Thimerosal  No family history on file.  Social History Social History  Substance Use Topics  . Smoking status: Current Every Day Smoker    Packs/day: 1.00    Years: 15.00    Types: Cigarettes  . Smokeless tobacco: Never Used     Comment: 1 pk per day  . Alcohol use 4.8 oz/week    8 Cans of beer per week     Comment: Weekly    Review of Systems  Constitutional: No fever/chills Eyes: No visual changes.  ENT: as above Cardiovascular: Denies chest pain. Respiratory: Denies  shortness of breath. Gastrointestinal: No abdominal pain.  No nausea, no vomiting.   Genitourinary: Negative for dysuria. Musculoskeletal: Negative for back pain. Skin: as above Neurological: Negative for headaches or weakness   ____________________________________________   PHYSICAL EXAM:  VITAL SIGNS: ED Triage Vitals  Enc Vitals Group     BP 03/10/17 1300 140/88     Pulse Rate 03/10/17 1300 (!) 117     Resp 03/10/17 1300 (!) 22     Temp 03/10/17 1300 97.9 F (36.6 C)     Temp src --      SpO2 03/10/17 1259 (!) 83 %      Weight 03/10/17 1301 99.8 kg (220 lb)     Height 03/10/17 1301 1.829 m (6')     Head Circumference --      Peak Flow --      Pain Score 03/10/17 1259 0     Pain Loc --      Pain Edu? --      Excl. in GC? --     Constitutional: Alert and oriented. No acute distress. Pleasant and interactive Eyes: Conjunctivae are normal.  Head: Atraumatic. Nose: No congestion/rhinnorhea. Mouth/Throat: Mucous membranes are moist.  Pharynx appears normal Neck:  Painless ROM Cardiovascular: Normal rate, regular rhythm. Grossly normal heart sounds.  Good peripheral circulation. Respiratory: Normal respiratory effort.  No retractions. Lungs CTAB. Gastrointestinal: Soft and nontender. No distention.  No CVA tenderness. Genitourinary: deferred Musculoskeletal: No lower extremity tenderness nor edema.  Warm and well perfused Neurologic:  Normal speech and language. No gross focal neurologic deficits are appreciated.  Skin:  Skin is warm, dry and intact. Urticarial rash diffusely to the legs and abdomen, face is spared Psychiatric: Mood and affect are normal. Speech and behavior are normal.  ____________________________________________   LABS (all labs ordered are listed, but only abnormal results are displayed)  Labs Reviewed  CBC - Abnormal; Notable for the following:       Result Value   RBC 6.24 (*)    Hemoglobin 20.6 (*)    HCT 59.7 (*)    All other components within normal limits  COMPREHENSIVE METABOLIC PANEL - Abnormal; Notable for the following:    Glucose, Bld 146 (*)    AST 53 (*)    All other components within normal limits   ____________________________________________  EKG  None ____________________________________________  RADIOLOGY  Chest x-ray ____________________________________________   PROCEDURES  Procedure(s) performed: No    Critical Care performed: yes  CRITICAL CARE Performed by: Jene Every   Total critical care time: 30 minutes  Critical care  time was exclusive of separately billable procedures and treating other patients.  Critical care was necessary to treat or prevent imminent or life-threatening deterioration.  Critical care was time spent personally by me on the following activities: development of treatment plan with patient and/or surrogate as well as nursing, discussions with consultants, evaluation of patient's response to treatment, examination of patient, obtaining history from patient or surrogate, ordering and performing treatments and interventions, ordering and review of laboratory studies, ordering and review of radiographic studies, pulse oximetry and re-evaluation of patient's condition.  ____________________________________________   INITIAL IMPRESSION / ASSESSMENT AND PLAN / ED COURSE  Pertinent labs & imaging results that were available during my care of the patient were reviewed by me and considered in my medical decision making (see chart for details).  Patient presents with abrupt onset of urticarial rash as well as a sensation of throat swelling, c/w anaphylaxis, given IM epinephrine,  Solu-Medrol, Pepcid, Benadryl IV and monitored closely. Found to be somewhat hypoxic upon arrival doubt this is related to allergic reaction, patient does have a history of smoking we will obtain x-ray to evaluate further  ----------------------------------------- 3:11 PM on 03/10/2017 -----------------------------------------  Rash has completely resolved. Patient feels well. I will treat him with a DuoNeb for scattered wheezes, patient reports he is quitting smoking, suspect reactive airway disease  Discussed with patient not to take zofran. Also d/w patient his elevated hgb and the need for outpatient follow up.   After neb patient 95% on RA. Ok for discharge    ____________________________________________   FINAL CLINICAL IMPRESSION(S) / ED DIAGNOSES  Final diagnoses:  Anaphylaxis, initial encounter  Allergic  reaction, initial encounter  Bronchospasm      NEW MEDICATIONS STARTED DURING THIS VISIT:  New Prescriptions   ALBUTEROL (PROVENTIL HFA;VENTOLIN HFA) 108 (90 BASE) MCG/ACT INHALER    Inhale 2 puffs into the lungs every 6 (six) hours as needed for wheezing or shortness of breath.   PREDNISONE (DELTASONE) 50 MG TABLET    Take 1 tablet (50 mg total) by mouth daily with breakfast.     Note:  This document was prepared using Dragon voice recognition software and may include unintentional dictation errors.    Jene Every, MD 03/10/17 [phone]

## 2017-03-10 NOTE — ED Notes (Signed)
Pt rash has subsided. Pt reports feels much better. Pt given water per request.

## 2017-03-31 ENCOUNTER — Ambulatory Visit: Payer: Self-pay | Admitting: Surgery

## 2017-07-02 ENCOUNTER — Other Ambulatory Visit: Payer: Self-pay

## 2017-07-02 DIAGNOSIS — I824Y2 Acute embolism and thrombosis of unspecified deep veins of left proximal lower extremity: Secondary | ICD-10-CM

## 2017-07-31 ENCOUNTER — Ambulatory Visit (INDEPENDENT_AMBULATORY_CARE_PROVIDER_SITE_OTHER): Payer: BLUE CROSS/BLUE SHIELD | Admitting: Vascular Surgery

## 2017-07-31 ENCOUNTER — Ambulatory Visit (HOSPITAL_COMMUNITY)
Admission: RE | Admit: 2017-07-31 | Discharge: 2017-07-31 | Disposition: A | Discharge status: Home / Self Care | Payer: BLUE CROSS/BLUE SHIELD | Source: Ambulatory Visit | Attending: Family | Admitting: Family

## 2017-07-31 ENCOUNTER — Ambulatory Visit (INDEPENDENT_AMBULATORY_CARE_PROVIDER_SITE_OTHER)
Admission: RE | Admit: 2017-07-31 | Discharge: 2017-07-31 | Disposition: A | Discharge status: Home / Self Care | Payer: BLUE CROSS/BLUE SHIELD | Source: Ambulatory Visit | Attending: Family | Admitting: Family

## 2017-07-31 ENCOUNTER — Encounter: Payer: Self-pay | Admitting: Vascular Surgery

## 2017-07-31 VITALS — BP 125/80 | HR 96 | Resp 21 | Ht 72.0 in | Wt 224.8 lb

## 2017-07-31 DIAGNOSIS — R2242 Localized swelling, mass and lump, left lower limb: Secondary | ICD-10-CM | POA: Insufficient documentation

## 2017-07-31 DIAGNOSIS — I824Y2 Acute embolism and thrombosis of unspecified deep veins of left proximal lower extremity: Secondary | ICD-10-CM

## 2017-07-31 DIAGNOSIS — Z86718 Personal history of other venous thrombosis and embolism: Secondary | ICD-10-CM | POA: Insufficient documentation

## 2017-07-31 DIAGNOSIS — I87009 Postthrombotic syndrome without complications of unspecified extremity: Secondary | ICD-10-CM | POA: Diagnosis not present

## 2017-07-31 NOTE — Progress Notes (Signed)
[facility] [place]     History of Present Illness  Scott Quinn is a 57 y.o. male who presents for follow up s/p acute Left  DVT in the  popliteal superficial femoral vein without evidence of iliac venous stenosis.  The first episode occurred 2 years previously and he was treated with Coumadin for 6 months.  The second occurrence started 07/17/2016 when he reported to [facility] ED.  He underwent thrombolysis 07/19/2016 and was placed on Xarelto.  He continues to take Xarelto daily and wear compression socks.     Past medical history includes current daily tobacco use and COPD.  He denies HTN, CAD and Cholesterolemia.  He is not currently followed by a PCP.        Past Medical History:  Diagnosis Date  . Costochondritis   . Diabetes mellitus without complication (HCC)   . DVT (deep venous thrombosis) (HCC)     Past Surgical History:  Procedure Laterality Date  . BACK SURGERY    . CARDIAC CATHETERIZATION N/A 07/19/2016   Procedure: Intravascular Ultrasound/IVUS;  Surgeon: Sherren Kerns, MD;  Location: [facility] INVASIVE CV LAB;  Service: Cardiovascular;  Laterality: N/A;  . INNER EAR SURGERY    . JOINT REPLACEMENT     knee surgery  . NASAL SEPTUM SURGERY    . PERIPHERAL VASCULAR CATHETERIZATION Left 07/19/2016   Procedure: Lower Extremity Venography;  Surgeon: Sherren Kerns, MD;  Location: [facility] INVASIVE CV LAB;  Service: Cardiovascular;  Laterality: Left;  . PERIPHERAL VASCULAR CATHETERIZATION  07/19/2016   Procedure: Thrombectomy;  Surgeon: Sherren Kerns, MD;  Location: [facility] INVASIVE CV LAB;  Service: Cardiovascular;;  left venous system     Social History   Socioeconomic History  . Marital status: Single    Spouse name: Not on file  . Number of children: Not on file  . Years of education: Not on file  . Highest education level: Not on file  Social Needs  . Financial resource strain: Not on file  . Food insecurity - worry: Not on file  . Food insecurity -  inability: Not on file  . Transportation needs - medical: Not on file  . Transportation needs - non-medical: Not on file  Occupational History  . Not on file  Tobacco Use  . Smoking status: Current Every Day Smoker    Packs/day: 1.00    Years: 15.00    Pack years: 15.00    Types: Cigarettes  . Smokeless tobacco: Never Used  . Tobacco comment: 1 pk per day  Substance and Sexual Activity  . Alcohol use: Yes    Alcohol/week: 4.8 oz    Types: 8 Cans of beer per week    Comment: Weekly  . Drug use: No  . Sexual activity: Yes  Other Topics Concern  . Not on file  Social History Narrative  . Not on file    History reviewed. No pertinent family history.  Current Outpatient Medications on File Prior to Visit  Medication Sig Dispense Refill  . albuterol (PROVENTIL HFA;VENTOLIN HFA) 108 (90 Base) MCG/ACT inhaler Inhale 2 puffs into the lungs every 6 (six) hours as needed for wheezing or shortness of breath. 1 Inhaler 2  . rivaroxaban (XARELTO) 20 MG TABS tablet Take 1 tablet (20 mg total) by mouth daily with supper. Start after starter pack completed 30 tablet 3  . ondansetron (ZOFRAN ODT) 4 MG disintegrating tablet Take 1 tablet (4 mg total) by mouth every 8 (eight) hours  as needed for nausea or vomiting. (Patient not taking: Reported on 07/31/2017) 20 tablet 0  . predniSONE (DELTASONE) 50 MG tablet Take 1 tablet (50 mg total) by mouth daily with breakfast. (Patient not taking: Reported on 07/31/2017) 5 tablet 0  . traMADol (ULTRAM) 50 MG tablet Take 1 tablet (50 mg total) by mouth every 6 (six) hours as needed. (Patient not taking: Reported on 07/31/2017) 12 tablet 0   No current facility-administered medications on file prior to visit.     Allergies as of 07/31/2017 - Review Complete 07/31/2017  Allergen Reaction Noted  . Thimerosal Other (See Comments) 09/13/2013     ROS:   General:  No weight loss, Fever, chills  HEENT: No recent headaches, no nasal bleeding, no visual  changes, no sore throat  Neurologic: No dizziness, blackouts, seizures. No recent symptoms of stroke or mini- stroke. No recent episodes of slurred speech, or temporary blindness.  Cardiac: No recent episodes of chest pain/pressure, no shortness of breath at rest.  No shortness of breath with exertion.  Denies history of atrial fibrillation or irregular heartbeat  Vascular: No history of rest pain in feet.  No history of claudication.  No history of non-healing ulcer, Positive history of DVT   Pulmonary: No home oxygen, no productive cough, no hemoptysis,  positive asthma or wheezing  Musculoskeletal:  [ ]  Arthritis, [ ]  Low back pain,  [ ]  Joint pain  Hematologic:No history of hypercoagulable state.  No history of easy bleeding.  No history of anemia  Gastrointestinal: No hematochezia or melena,  No gastroesophageal reflux, no trouble swallowing  Urinary: [ ]  chronic Kidney disease, [ ]  on HD - [ ]  MWF or [ ]  TTHS, [ ]  Burning with urination, [ ]  Frequent urination, [ ]  Difficulty urinating;   Skin: No rashes  Psychological: No history of anxiety,  No history of depression  Physical Examination  Vitals:   07/31/17 1558  BP: 125/80  Pulse: 96  Resp: (!) 21  SpO2: 95%  Weight: 224 lb 12.8 oz (102 kg)  Height: 6' (1.829 m)    Body mass index is 30.49 kg/m.  General:  Alert and oriented, no acute distress HEENT: Normal Neck: No bruit or JVD Pulmonary: Clear to auscultation bilaterally Cardiac: Regular Rate and Rhythm without murmur Abdomen: Soft, non-tender, non-distended, no mass, no scars Skin: No open wounds and no evidence of previous open wounds.  Posterior calf visible varicosity.  Extremity Pulses:  2+ radial, brachial, femoral, dorsalis pedis, posterior tibial pulses bilaterally Musculoskeletal: left LE positive edema, multiple broken veins and brainy skin changes to the left leg below the knee.    Neurologic: Upper and lower extremity motor 5/5 and  symmetric  DATA: Venous DVT study Patent full compressible venous system with minimal narrowing posterior popliteal vein.   Evidence of chronic venous insufficiency popliteal vein.  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  Plan: He has been faithful with wearing his compression socks and taking his Xarelto daily.  He will be on Xarelto for life at this point due to history.  If he develops skin breakdown or has a sudden swollen limb again he will call us.  Otherwise he will follow up on an as needed basis.  We did make a referral to [facility] medical to try and establish him with a PCP in the near future.  Until then we will be glad to refill his Xarelto as needed.    Mosetta Pigeon PA-C [facility]  The patient was seen in conjunction with Dr. Darrick PennaFields today  Story and exam findings as above.  The patient has had 2 prior DVT episodes.  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  Hopefully his swelling symptoms will not get worse over time.  He will follow-up with us on an as-needed basis if he has recurrent symptoms of DVT.  I stressed to him that it is important for him to establish a primary care doctor with all of his multiple medical problems long-term.  Fabienne Brunsharles Fields, MD [facility] [place] Office: [phone] Pager: [phone]

## 2017-10-08 ENCOUNTER — Other Ambulatory Visit: Payer: Self-pay | Admitting: Vascular Surgery

## 2017-10-08 DIAGNOSIS — I824Z9 Acute embolism and thrombosis of unspecified deep veins of unspecified distal lower extremity: Secondary | ICD-10-CM

## 2017-11-19 ENCOUNTER — Emergency Department: Payer: BLUE CROSS/BLUE SHIELD

## 2017-11-19 ENCOUNTER — Emergency Department
Admission: EM | Admit: 2017-11-19 | Discharge: 2017-11-19 | Disposition: A | Discharge status: Home / Self Care | Payer: BLUE CROSS/BLUE SHIELD | Attending: Emergency Medicine | Admitting: Emergency Medicine

## 2017-11-19 ENCOUNTER — Encounter: Payer: Self-pay | Admitting: Emergency Medicine

## 2017-11-19 DIAGNOSIS — M25512 Pain in left shoulder: Secondary | ICD-10-CM | POA: Diagnosis present

## 2017-11-19 DIAGNOSIS — E119 Type 2 diabetes mellitus without complications: Secondary | ICD-10-CM | POA: Diagnosis not present

## 2017-11-19 DIAGNOSIS — F17210 Nicotine dependence, cigarettes, uncomplicated: Secondary | ICD-10-CM | POA: Insufficient documentation

## 2017-11-19 DIAGNOSIS — Z79899 Other long term (current) drug therapy: Secondary | ICD-10-CM | POA: Diagnosis not present

## 2017-11-19 DIAGNOSIS — W19XXXA Unspecified fall, initial encounter: Secondary | ICD-10-CM

## 2017-11-19 DIAGNOSIS — Y92009 Unspecified place in unspecified non-institutional (private) residence as the place of occurrence of the external cause: Secondary | ICD-10-CM

## 2017-11-19 MED ORDER — HYDROCODONE-ACETAMINOPHEN 5-325 MG PO TABS: 1.0000 | ORAL_TABLET | Freq: Four times a day (QID) | ORAL | 0 refills | Status: DC | PRN

## 2017-11-19 NOTE — ED Provider Notes (Signed)
[facility] Emergency Department Provider Note  ____________________________________________   First MD Initiated Contact with Patient 11/19/17 1723     (approximate)  I have reviewed the triage vital signs and the nursing notes.   HISTORY  Chief Complaint Fall    HPI BEREKET GERNERT is a 57 y.o. male who presents emergency department complaining of left arm pain after a fall on Monday.  He states he was playing with his dog outside and he tripped falling directly on the left arm.  He states his shoulders been very sore since then.  Pain radiates to the elbow.  He states it hurts to move the arm.  He denies any numbness or tingling.  Denies neck pain or head injury.  He is on Xarelto.  Past Medical History:  Diagnosis Date  . Costochondritis   . Diabetes mellitus without complication (HCC)   . DVT (deep venous thrombosis) [facility])     Patient Active Problem List   Diagnosis Date Noted  . Type 2 diabetes mellitus (HCC) 07/03/2015  . Alcohol abuse 06/13/2015  . Colon cancer screening 03/03/2014  . Encounter for therapeutic drug monitoring 10/28/2013  . DVT (deep venous thrombosis) (HCC) 10/07/2013  . DVT of lower limb, acute (HCC) 09/22/2013  . Tobacco use 09/22/2013    Past Surgical History:  Procedure Laterality Date  . BACK SURGERY    . CARDIAC CATHETERIZATION N/A 07/19/2016   Procedure: Intravascular Ultrasound/IVUS;  Surgeon: Sherren Kerns, MD;  Location: [facility] INVASIVE CV LAB;  Service: Cardiovascular;  Laterality: N/A;  . INNER EAR SURGERY    . JOINT REPLACEMENT     knee surgery  . NASAL SEPTUM SURGERY    . PERIPHERAL VASCULAR CATHETERIZATION Left 07/19/2016   Procedure: Lower Extremity Venography;  Surgeon: Sherren Kerns, MD;  Location: [facility] INVASIVE CV LAB;  Service: Cardiovascular;  Laterality: Left;  . PERIPHERAL VASCULAR CATHETERIZATION  07/19/2016   Procedure: Thrombectomy;  Surgeon: Sherren Kerns, MD;  Location: [facility] INVASIVE CV LAB;   Service: Cardiovascular;;  left venous system     Prior to Admission medications   Medication Sig Start Date End Date Taking? Authorizing Provider  albuterol (PROVENTIL HFA;VENTOLIN HFA) 108 (90 Base) MCG/ACT inhaler Inhale 2 puffs into the lungs every 6 (six) hours as needed for wheezing or shortness of breath. 03/10/17   Jene Every, MD  HYDROcodone-acetaminophen (NORCO/VICODIN) 5-325 MG tablet Take 1 tablet by mouth every 6 (six) hours as needed for moderate pain. 11/19/17   Janeisha Ryle, Roselyn Bering, PA-C  XARELTO 20 MG TABS tablet TAKE 1 TABLET BY MOUTH EVERY DAY WITH SUPPER, START AFTER STARTER PAK COMPLETED 10/08/17   Maeola Harman, MD    Allergies Thimerosal  No family history on file.  Social History Social History   Tobacco Use  . Smoking status: Current Every Day Smoker    Packs/day: 1.00    Years: 15.00    Pack years: 15.00    Types: Cigarettes  . Smokeless tobacco: Never Used  . Tobacco comment: 1 pk per day  Substance Use Topics  . Alcohol use: Yes    Alcohol/week: 4.8 oz    Types: 8 Cans of beer per week    Comment: Weekly  . Drug use: No    Review of Systems  Constitutional: No fever/chills Eyes: No visual changes. ENT: No sore throat. Respiratory: Denies cough Genitourinary: Negative for dysuria. Musculoskeletal: Negative for back pain.  Positive for left shoulder pain Skin: Negative for rash.  ____________________________________________   PHYSICAL EXAM:  VITAL SIGNS: ED Triage Vitals  Enc Vitals Group     BP 11/19/17 1711 140/87     Pulse Rate 11/19/17 1711 100     Resp 11/19/17 1711 18     Temp 11/19/17 1711 98.7 F (37.1 C)     Temp Source 11/19/17 1711 Oral     SpO2 11/19/17 1711 96 %     Weight 11/19/17 1712 220 lb (99.8 kg)     Height 11/19/17 1712 6' (1.829 m)     Head Circumference --      Peak Flow --      Pain Score 11/19/17 1712 7     Pain Loc --      Pain Edu? --      Excl. in GC? --     Constitutional: Alert and  oriented. Well appearing and in no acute distress. Eyes: Conjunctivae are normal.  Head: Atraumatic. Nose: No congestion/rhinnorhea. Mouth/Throat: Mucous membranes are moist.   Cardiovascular: Normal rate, regular rhythm.  Heart sounds are normal Respiratory: Normal respiratory effort.  No retractions, lungs clear to auscultation GU: deferred Musculoskeletal: Decreased range of motion of the left shoulder.  The patient has to use his other hand to lift the shoulder as it hurts to raise it by himself.  The ACJ joint is tender to palpation.  The humerus is tender to palpation.  There is no bruising noted.  Warm and well perfused.  Neurovascular is intact.  The C-spine and elbow are not tender Neurologic:  Normal speech and language.  Skin:  Skin is warm, dry and intact. No rash noted. Psychiatric: Mood and affect are normal. Speech and behavior are normal.  ____________________________________________   LABS (all labs ordered are listed, but only abnormal results are displayed)  Labs Reviewed - No data to display ____________________________________________   ____________________________________________  RADIOLOGY  X-ray of the left shoulder is negative for any acute bony abnormality  ____________________________________________   PROCEDURES  Procedure(s) performed: Sling was applied by the tech  Procedures    ____________________________________________   INITIAL IMPRESSION / ASSESSMENT AND PLAN / ED COURSE  Pertinent labs & imaging results that were available during my care of the patient were reviewed by me and considered in my medical decision making (see chart for details).  Patient is 57 year old male presenting to the emergency department complaining of left shoulder pain after he tripped and fell while playing with his dog.  On physical exam the left shoulder is tender to palpation.  There is no bruising noted.  Patient has decreased range of motion secondary to  pain.  X-ray of the left shoulder is negative for any bony abnormality  X-ray results were explained to the patient.  A sling was applied by the tech.  Patient was instructed to apply ice.  He was given a prescription for hydrocodone 5/325 #15 with no refill.  Since he is on Xarelto do not want to prescribe an anti-inflammatory at this time.  Patient was instructed to apply ice.  He is to follow-up with orthopedics.  Explained to him that Dr. Martha Clan does have a walk-in clinic if he is worsening.  Otherwise he can return to the emergency department if worsening.  He states he understands will comply with our instructions.  He was discharged in stable condition     As part of my medical decision making, I reviewed the following data within the electronic MEDICAL RECORD NUMBER Nursing notes reviewed and incorporated, Old chart  reviewed, Radiograph reviewed x-ray of the left shoulder is negative for any acute abnormality, Notes from prior ED visits and [place] Controlled Substance Database  ____________________________________________   FINAL CLINICAL IMPRESSION(S) / ED DIAGNOSES  Final diagnoses:  Acute pain of left shoulder  Fall in home, initial encounter      NEW MEDICATIONS STARTED DURING THIS VISIT:  New Prescriptions   HYDROCODONE-ACETAMINOPHEN (NORCO/VICODIN) 5-325 MG TABLET    Take 1 tablet by mouth every 6 (six) hours as needed for moderate pain.     Note:  This document was prepared using Dragon voice recognition software and may include unintentional dictation errors.    Faythe Ghee, PA-C 11/19/17 Claria Dice    Sharman Cheek, MD 11/19/17 2026

## 2017-11-19 NOTE — ED Triage Notes (Signed)
Patient presents to the ED with left arm pain post fall on Monday.  Patient states he was playing with his dog and tripped.  Patient states pain is mostly in his left shoulder radiating into his left elbow.

## 2017-11-19 NOTE — Discharge Instructions (Addendum)
Follow-up with Dr. Martha Clan if you are not better in 5 to 7 days.  Place ice on the left shoulder.  Wear the sling for 3 to 4 days for comfort.  Take the pain medication as needed.  Due to you being on Xarelto it is not advised that you take ibuprofen or another anti-inflammatory.  Return to the emergency department if your worsening

## 2017-12-29 ENCOUNTER — Other Ambulatory Visit: Payer: Self-pay | Admitting: Orthopedic Surgery

## 2018-02-03 ENCOUNTER — Encounter
Admission: RE | Admit: 2018-02-03 | Discharge: 2018-02-03 | Disposition: A | Discharge status: Home / Self Care | Payer: BLUE CROSS/BLUE SHIELD | Source: Ambulatory Visit | Attending: Orthopedic Surgery | Admitting: Orthopedic Surgery

## 2018-02-03 ENCOUNTER — Other Ambulatory Visit: Payer: Self-pay

## 2018-02-03 NOTE — Patient Instructions (Addendum)
Your procedure is scheduled on: 02-10-18 TUESDAY Report to Same Day Surgery 2nd floor medical mall [facility](Medical Mall Entrance-take elevator on left to 2nd floor.  Check in with surgery information desk.) To find out your arrival time please call [phone] between 1PM - 3PM on 02-09-18 MONDAY  Remember: Instructions that are not followed completely may result in serious medical risk, up to and including death, or upon the discretion of your surgeon and anesthesiologist your surgery may need to be rescheduled.    _x___ 1. Do not eat food after midnight the night before your procedure. NO GUM OR CANDY AFTER MIDNIGHT.  You may drink clear liquids up to 2 hours before you are scheduled to arrive at the hospital for your procedure.  Do not drink clear liquids within 2 hours of your scheduled arrival to the hospital.  Clear liquids include  --Water or Apple juice without pulp  --Clear carbohydrate beverage such as ClearFast or Gatorade  --Black Coffee or Clear Tea (No milk, no creamers, do not add anything to  the coffee or Tea    __x__ 2. No Alcohol for 24 hours before or after surgery.   __x__3. No Smoking or e-cigarettes for 24 prior to surgery.  Do not use any chewable tobacco products for at least 6 hour prior to surgery   ____  4. Bring all medications with you on the day of surgery if instructed.    __x__ 5. Notify your doctor if there is any change in your medical condition     (cold, fever, infections).    x___6. On the morning of surgery brush your teeth with toothpaste and water.  You may rinse your mouth with mouth wash if you wish.  Do not swallow any toothpaste or mouthwash.   Do not wear jewelry, make-up, hairpins, clips or nail polish.  Do not wear lotions, powders, or perfumes. You may wear deodorant.  Do not shave 48 hours prior to surgery. Men may shave face and neck.  Do not bring valuables to the hospital.    [facility] is not responsible for any belongings or  valuables.               Contacts, dentures or bridgework may not be worn into surgery.  Leave your suitcase in the car. After surgery it may be brought to your room.  For patients admitted to the hospital, discharge time is determined by your treatment team.  _  Patients discharged the day of surgery will not be allowed to drive home.  You will need someone to drive you home and stay with you the night of your procedure.    Please read over the following fact sheets that you were given:   [facility] Pre[facility]  ____ Take anti-hypertensive listed below, cardiac, seizure, asthma, anti-reflux and psychiatric medicines. These include:  1. NONE  2.  3.  4.  5.  6.  ____Fleets enema or Magnesium Citrate as directed.   _x___ Use CHG Soap or sage wipes as directed on instruction sheet   ____ Use inhalers on the day of surgery and bring to hospital day of surgery  ____ Stop Metformin and Janumet 2 days prior to surgery.    ____ Take 1/2 of usual insulin dose the night before surgery and none on the morning surgery.   _X___ Follow recommendations from Cardiologist, Pulmonologist or PCP regarding stopping Aspirin, Coumadin, Plavix ,Eliquis, Effient, or Pradaxa, and Pletal-ASK DR KRASINSKI WHEN YOU NEED TO STOP  YOUR XARELTO  X____Stop Anti-inflammatories such as Advil, Aleve, Ibuprofen, Motrin, Naproxen, Naprosyn, Goodies powders or aspirin products NOW-OK to take Tylenol .   ____ Stop supplements until after surgery.     ____ Bring C-Pap to the hospital.

## 2018-02-04 ENCOUNTER — Inpatient Hospital Stay: Admission: RE | Admit: 2018-02-04 | Payer: BLUE CROSS/BLUE SHIELD | Source: Ambulatory Visit

## 2018-02-05 ENCOUNTER — Inpatient Hospital Stay: Admission: RE | Admit: 2018-02-05 | Payer: BLUE CROSS/BLUE SHIELD | Source: Ambulatory Visit

## 2018-02-09 ENCOUNTER — Encounter
Admission: RE | Admit: 2018-02-09 | Discharge: 2018-02-09 | Disposition: A | Discharge status: Home / Self Care | Payer: BLUE CROSS/BLUE SHIELD | Source: Ambulatory Visit | Attending: Orthopedic Surgery | Admitting: Orthopedic Surgery

## 2018-02-09 DIAGNOSIS — Z01812 Encounter for preprocedural laboratory examination: Secondary | ICD-10-CM | POA: Insufficient documentation

## 2018-02-09 LAB — BASIC METABOLIC PANEL
Anion gap: 7 (ref 5–15)
BUN: 7 mg/dL (ref 6–20)
CALCIUM: 8.8 mg/dL — AB (ref 8.9–10.3)
CO2: 31 mmol/L (ref 22–32)
CREATININE: 0.63 mg/dL (ref 0.61–1.24)
Chloride: 103 mmol/L (ref 98–111)
GFR calc non Af Amer: >60 mL/min (ref >60)
Glucose, Bld: 101 mg/dL — ABNORMAL HIGH (ref 70–99)
Potassium: 4.5 mmol/L (ref 3.5–5.1)
Sodium: 141 mmol/L (ref 135–145)

## 2018-02-09 LAB — CBC WITH DIFFERENTIAL/PLATELET
BASOS PCT: 1 %
Basophils Absolute: 0 10*3/uL (ref 0–0.1)
EOS ABS: 0.1 10*3/uL (ref 0–0.7)
EOS PCT: 2 %
HCT: 53.5 % — ABNORMAL HIGH (ref 40.0–52.0)
HEMOGLOBIN: 18.5 g/dL — AB (ref 13.0–18.0)
LYMPHS ABS: 0.7 10*3/uL — AB (ref 1.0–3.6)
Lymphocytes Relative: 26 %
MCH: 33.2 pg (ref 26.0–34.0)
MCHC: 34.7 g/dL (ref 32.0–36.0)
MCV: 95.8 fL (ref 80.0–100.0)
Monocytes Absolute: 0.6 10*3/uL (ref 0.2–1.0)
Monocytes Relative: 21 %
NEUTROS PCT: 50 %
Neutro Abs: 1.3 10*3/uL — ABNORMAL LOW (ref 1.4–6.5)
PLATELETS: 124 10*3/uL — AB (ref 150–440)
RBC: 5.58 MIL/uL (ref 4.40–5.90)
RDW: 14.1 % (ref 11.5–14.5)
WBC: 2.7 10*3/uL — AB (ref 3.8–10.6)

## 2018-02-09 LAB — PROTIME-INR
INR: 0.96
PROTHROMBIN TIME: 12.7 s (ref 11.4–15.2)

## 2018-02-09 LAB — APTT: aPTT: 35 seconds (ref 24–36)

## 2018-02-09 MED ORDER — CEFAZOLIN SODIUM-DEXTROSE 2-4 GM/100ML-% IV SOLN
2.0000 g | INTRAVENOUS | Status: AC
  Administered 2018-02-10: 2 g via INTRAVENOUS

## 2018-02-10 ENCOUNTER — Ambulatory Visit: Payer: BLUE CROSS/BLUE SHIELD | Admitting: Certified Registered"

## 2018-02-10 ENCOUNTER — Other Ambulatory Visit: Payer: Self-pay

## 2018-02-10 ENCOUNTER — Ambulatory Visit
Admission: RE | Admit: 2018-02-10 | Discharge: 2018-02-10 | Disposition: A | Discharge status: Home / Self Care | Payer: BLUE CROSS/BLUE SHIELD | Source: Ambulatory Visit | Attending: Orthopedic Surgery | Admitting: Orthopedic Surgery

## 2018-02-10 ENCOUNTER — Encounter
Admission: RE | Disposition: A | Discharge status: Home / Self Care | Payer: Self-pay | Source: Ambulatory Visit | Attending: Orthopedic Surgery

## 2018-02-10 DIAGNOSIS — K219 Gastro-esophageal reflux disease without esophagitis: Secondary | ICD-10-CM | POA: Diagnosis not present

## 2018-02-10 DIAGNOSIS — F17200 Nicotine dependence, unspecified, uncomplicated: Secondary | ICD-10-CM | POA: Insufficient documentation

## 2018-02-10 DIAGNOSIS — M66322 Spontaneous rupture of flexor tendons, left upper arm: Secondary | ICD-10-CM | POA: Diagnosis not present

## 2018-02-10 DIAGNOSIS — M7542 Impingement syndrome of left shoulder: Secondary | ICD-10-CM | POA: Diagnosis not present

## 2018-02-10 DIAGNOSIS — E1151 Type 2 diabetes mellitus with diabetic peripheral angiopathy without gangrene: Secondary | ICD-10-CM | POA: Insufficient documentation

## 2018-02-10 DIAGNOSIS — Z79899 Other long term (current) drug therapy: Secondary | ICD-10-CM | POA: Insufficient documentation

## 2018-02-10 DIAGNOSIS — M19012 Primary osteoarthritis, left shoulder: Secondary | ICD-10-CM | POA: Insufficient documentation

## 2018-02-10 DIAGNOSIS — M75122 Complete rotator cuff tear or rupture of left shoulder, not specified as traumatic: Secondary | ICD-10-CM | POA: Diagnosis not present

## 2018-02-10 HISTORY — PX: SHOULDER ARTHROSCOPY WITH OPEN ROTATOR CUFF REPAIR: SHX6092

## 2018-02-10 LAB — GLUCOSE, CAPILLARY
Glucose-Capillary: 85 mg/dL (ref 70–99)
Glucose-Capillary: 119 mg/dL — ABNORMAL HIGH (ref 70–99)

## 2018-02-10 SURGERY — ARTHROSCOPY, SHOULDER WITH REPAIR, ROTATOR CUFF, OPEN
Anesthesia: General | Site: Shoulder | Laterality: Left | Wound class: Clean

## 2018-02-10 MED ORDER — FAMOTIDINE 20 MG PO TABS
20.0000 mg | ORAL_TABLET | Freq: Once | ORAL | Status: AC
  Administered 2018-02-10: 20 mg via ORAL

## 2018-02-10 MED ORDER — ONDANSETRON HCL 4 MG PO TABS: 4.0000 mg | ORAL_TABLET | Freq: Three times a day (TID) | ORAL | 0 refills | Status: DC | PRN

## 2018-02-10 MED ORDER — FENTANYL CITRATE (PF) 100 MCG/2ML IJ SOLN: 25.0000 ug | INTRAMUSCULAR | Status: DC | PRN

## 2018-02-10 MED ORDER — BUPIVACAINE LIPOSOME 1.3 % IJ SUSP
INTRAMUSCULAR | Status: DC | PRN
  Administered 2018-02-10: 13 mL via PERINEURAL
  Administered 2018-02-10: 7 mL via PERINEURAL

## 2018-02-10 MED ORDER — DEXAMETHASONE SODIUM PHOSPHATE 10 MG/ML IJ SOLN
INTRAMUSCULAR | Status: AC
Start: 2018-02-10 — End: ?
  Filled 2018-02-10: qty 1

## 2018-02-10 MED ORDER — MIDAZOLAM HCL 2 MG/2ML IJ SOLN
INTRAMUSCULAR | Status: AC
  Filled 2018-02-10: qty 2

## 2018-02-10 MED ORDER — BUPIVACAINE HCL (PF) 0.25 % IJ SOLN
INTRAMUSCULAR | Status: DC | PRN
  Administered 2018-02-10: 30 mL

## 2018-02-10 MED ORDER — NEOMYCIN-POLYMYXIN B GU 40-200000 IR SOLN
Status: AC
  Filled 2018-02-10: qty 2

## 2018-02-10 MED ORDER — OXYCODONE HCL 5 MG PO TABS: 5.0000 mg | ORAL_TABLET | ORAL | 0 refills | Status: DC | PRN

## 2018-02-10 MED ORDER — SUGAMMADEX SODIUM 200 MG/2ML IV SOLN
INTRAVENOUS | Status: DC | PRN
  Administered 2018-02-10: 200 mg via INTRAVENOUS

## 2018-02-10 MED ORDER — SUGAMMADEX SODIUM 200 MG/2ML IV SOLN
INTRAVENOUS | Status: AC
  Filled 2018-02-10: qty 2

## 2018-02-10 MED ORDER — ACETAMINOPHEN 10 MG/ML IV SOLN
INTRAVENOUS | Status: DC | PRN
  Administered 2018-02-10: 1000 mg via INTRAVENOUS

## 2018-02-10 MED ORDER — GLYCOPYRROLATE 0.2 MG/ML IJ SOLN
INTRAMUSCULAR | Status: DC | PRN
  Administered 2018-02-10: 0.2 mg via INTRAVENOUS

## 2018-02-10 MED ORDER — SODIUM CHLORIDE 0.9 % IV SOLN
INTRAVENOUS | Status: DC
  Administered 2018-02-10: 10:00:00 via INTRAVENOUS

## 2018-02-10 MED ORDER — LIDOCAINE HCL (CARDIAC) PF 100 MG/5ML IV SOSY
PREFILLED_SYRINGE | INTRAVENOUS | Status: DC | PRN
  Administered 2018-02-10: 100 mg via INTRAVENOUS

## 2018-02-10 MED ORDER — ROCURONIUM BROMIDE 100 MG/10ML IV SOLN
INTRAVENOUS | Status: DC | PRN
  Administered 2018-02-10: 45 mg via INTRAVENOUS
  Administered 2018-02-10: 5 mg via INTRAVENOUS
  Administered 2018-02-10 (×2): 10 mg via INTRAVENOUS

## 2018-02-10 MED ORDER — CHLORHEXIDINE GLUCONATE CLOTH 2 % EX PADS: 6.0000 | MEDICATED_PAD | Freq: Once | CUTANEOUS | Status: DC

## 2018-02-10 MED ORDER — CEFAZOLIN SODIUM-DEXTROSE 2-4 GM/100ML-% IV SOLN
INTRAVENOUS | Status: AC
  Filled 2018-02-10: qty 100

## 2018-02-10 MED ORDER — BUPIVACAINE HCL (PF) 0.25 % IJ SOLN
INTRAMUSCULAR | Status: AC
  Filled 2018-02-10: qty 30

## 2018-02-10 MED ORDER — LACTATED RINGERS IV SOLN: INTRAVENOUS | Status: DC

## 2018-02-10 MED ORDER — FAMOTIDINE 20 MG PO TABS
ORAL_TABLET | ORAL | Status: AC
  Filled 2018-02-10: qty 1

## 2018-02-10 MED ORDER — MIDAZOLAM HCL 2 MG/2ML IJ SOLN: 1.0000 mg | Freq: Once | INTRAMUSCULAR | Status: DC

## 2018-02-10 MED ORDER — EPINEPHRINE 30 MG/30ML IJ SOLN
INTRAMUSCULAR | Status: AC
  Filled 2018-02-10: qty 1

## 2018-02-10 MED ORDER — ONDANSETRON HCL 4 MG/2ML IJ SOLN
INTRAMUSCULAR | Status: DC | PRN
  Administered 2018-02-10: 4 mg via INTRAVENOUS

## 2018-02-10 MED ORDER — IPRATROPIUM-ALBUTEROL 0.5-2.5 (3) MG/3ML IN SOLN
RESPIRATORY_TRACT | Status: AC
  Filled 2018-02-10: qty 3

## 2018-02-10 MED ORDER — MIDAZOLAM HCL 2 MG/2ML IJ SOLN
INTRAMUSCULAR | Status: DC | PRN
  Administered 2018-02-10: 2 mg via INTRAVENOUS

## 2018-02-10 MED ORDER — BUPIVACAINE HCL (PF) 0.5 % IJ SOLN
INTRAMUSCULAR | Status: AC
  Filled 2018-02-10: qty 10

## 2018-02-10 MED ORDER — OXYCODONE HCL 5 MG PO TABS: 5.0000 mg | ORAL_TABLET | Freq: Once | ORAL | Status: DC | PRN

## 2018-02-10 MED ORDER — FENTANYL CITRATE (PF) 100 MCG/2ML IJ SOLN
50.0000 ug | Freq: Once | INTRAMUSCULAR | Status: AC
  Administered 2018-02-10: 50 ug via INTRAVENOUS

## 2018-02-10 MED ORDER — FENTANYL CITRATE (PF) 100 MCG/2ML IJ SOLN
INTRAMUSCULAR | Status: DC | PRN
  Administered 2018-02-10: 100 ug via INTRAVENOUS

## 2018-02-10 MED ORDER — OXYCODONE HCL 5 MG/5ML PO SOLN: 5.0000 mg | Freq: Once | ORAL | Status: DC | PRN

## 2018-02-10 MED ORDER — NEOMYCIN-POLYMYXIN B GU 40-200000 IR SOLN
Status: DC | PRN
  Administered 2018-02-10: 2 mL

## 2018-02-10 MED ORDER — SODIUM CHLORIDE 0.9 % IV SOLN
INTRAVENOUS | Status: DC | PRN
  Administered 2018-02-10: 25 ug/min via INTRAVENOUS

## 2018-02-10 MED ORDER — LIDOCAINE HCL (PF) 2 % IJ SOLN
INTRAMUSCULAR | Status: AC
  Filled 2018-02-10: qty 10

## 2018-02-10 MED ORDER — ONDANSETRON HCL 4 MG/2ML IJ SOLN
INTRAMUSCULAR | Status: AC
  Filled 2018-02-10: qty 2

## 2018-02-10 MED ORDER — DEXAMETHASONE SODIUM PHOSPHATE 10 MG/ML IJ SOLN
INTRAMUSCULAR | Status: DC | PRN
  Administered 2018-02-10: 5 mg via INTRAVENOUS

## 2018-02-10 MED ORDER — ROCURONIUM BROMIDE 50 MG/5ML IV SOLN
INTRAVENOUS | Status: AC
  Filled 2018-02-10: qty 1

## 2018-02-10 MED ORDER — PROPOFOL 10 MG/ML IV BOLUS
INTRAVENOUS | Status: DC | PRN
  Administered 2018-02-10: 200 mg via INTRAVENOUS

## 2018-02-10 MED ORDER — DIPHENHYDRAMINE HCL 50 MG/ML IJ SOLN
INTRAMUSCULAR | Status: DC | PRN
  Administered 2018-02-10: 12.5 mg via INTRAVENOUS

## 2018-02-10 MED ORDER — IPRATROPIUM-ALBUTEROL 0.5-2.5 (3) MG/3ML IN SOLN
3.0000 mL | Freq: Once | RESPIRATORY_TRACT | Status: AC
  Administered 2018-02-10: 3 mL via RESPIRATORY_TRACT

## 2018-02-10 MED ORDER — EPINEPHRINE PF 1 MG/ML IJ SOLN
INTRAMUSCULAR | Status: DC | PRN
  Administered 2018-02-10: 8 mL

## 2018-02-10 MED ORDER — DIPHENHYDRAMINE HCL 50 MG/ML IJ SOLN
INTRAMUSCULAR | Status: AC
  Filled 2018-02-10: qty 1

## 2018-02-10 MED ORDER — BUPIVACAINE HCL (PF) 0.5 % IJ SOLN
INTRAMUSCULAR | Status: DC | PRN
  Administered 2018-02-10: 7 mL via PERINEURAL
  Administered 2018-02-10: 3 mL via PERINEURAL

## 2018-02-10 MED ORDER — PHENYLEPHRINE HCL 10 MG/ML IJ SOLN
INTRAMUSCULAR | Status: DC | PRN
  Administered 2018-02-10: 100 ug via INTRAVENOUS
  Administered 2018-02-10: 200 ug via INTRAVENOUS
  Administered 2018-02-10: 100 ug via INTRAVENOUS
  Administered 2018-02-10: 150 ug via INTRAVENOUS
  Administered 2018-02-10 (×3): 100 ug via INTRAVENOUS
  Administered 2018-02-10: 200 ug via INTRAVENOUS
  Administered 2018-02-10 (×2): 100 ug via INTRAVENOUS

## 2018-02-10 MED ORDER — LIDOCAINE HCL (PF) 1 % IJ SOLN
INTRAMUSCULAR | Status: DC
Start: 2018-02-10 — End: 2018-02-10
  Filled 2018-02-10: qty 5

## 2018-02-10 MED ORDER — BUPIVACAINE LIPOSOME 1.3 % IJ SUSP
INTRAMUSCULAR | Status: AC
  Filled 2018-02-10: qty 20

## 2018-02-10 MED ORDER — FENTANYL CITRATE (PF) 100 MCG/2ML IJ SOLN
INTRAMUSCULAR | Status: AC
  Filled 2018-02-10: qty 2

## 2018-02-10 MED ORDER — ACETAMINOPHEN 10 MG/ML IV SOLN
INTRAVENOUS | Status: AC
  Filled 2018-02-10: qty 100

## 2018-02-10 MED ORDER — FENTANYL CITRATE (PF) 100 MCG/2ML IJ SOLN
INTRAMUSCULAR | Status: AC
Start: 2018-02-10 — End: 2018-02-10
  Administered 2018-02-10: 50 ug via INTRAVENOUS
  Filled 2018-02-10: qty 2

## 2018-02-10 MED ORDER — EPHEDRINE SULFATE 50 MG/ML IJ SOLN
INTRAMUSCULAR | Status: DC | PRN
  Administered 2018-02-10 (×4): 10 mg via INTRAVENOUS
  Administered 2018-02-10 (×2): 5 mg via INTRAVENOUS

## 2018-02-10 SURGICAL SUPPLY — 88 items
ADAPTER IRRIG TUBE 2 SPIKE SOL (ADAPTER) ×2 IMPLANT
ADPR TBG 2 SPK PMP STRL ASCP (ADAPTER)
ADPR TBG Y 2 SPK STRL DISP CNT (ADAPTER) ×1
ANCH SUT 5.5 KNTLS PEEK (Orthopedic Implant) ×2 IMPLANT
ANCH SUT Q-FX 2.8 (Anchor) ×2 IMPLANT
ANCHOR ALL-SUT Q-FIX 2.8 (Anchor) ×4 IMPLANT
ANCHOR SUT 5.5 MULTIFIX (Orthopedic Implant) ×2 IMPLANT
ANCHOR SUT 5.5MM MULTIFIX (Orthopedic Implant) ×2 IMPLANT
BLADE CLIPPER SURG (BLADE) ×2 IMPLANT
BLADE SHAVER BONE 5.0MM X 13CM (MISCELLANEOUS) ×1
BLADE SHAVER BONE 5.0X13 (MISCELLANEOUS) ×1 IMPLANT
BUR RADIUS 4.0X18.5 (BURR) ×1 IMPLANT
BUR RADIUS 5.5 (BURR) ×1 IMPLANT
CANNULA 5.75X7 CRYSTAL CLEAR (CANNULA) ×6 IMPLANT
CANNULA PARTIAL THREAD 2X7 (CANNULA) ×3 IMPLANT
CANNULA TWIST IN 8.25X9CM (CANNULA) IMPLANT
CLOSURE WOUND 1/2 X4 (GAUZE/BANDAGES/DRESSINGS) ×2
CONNECTOR PERFECT PASSER (CONNECTOR) ×2 IMPLANT
COOLER POLAR GLACIER W/PUMP (MISCELLANEOUS) ×3 IMPLANT
CRADLE LAMINECT ARM (MISCELLANEOUS) ×3 IMPLANT
CUTTER BONE 4.0MM X 13CM (MISCELLANEOUS) ×2 IMPLANT
DEVICE SUCT BLK HOLE OR FLOOR (MISCELLANEOUS) ×2 IMPLANT
DRAPE IMP U-DRAPE 54X76 (DRAPES) ×6 IMPLANT
DRAPE INCISE IOBAN 66X45 STRL (DRAPES) ×3 IMPLANT
DRAPE SHEET LG 3/4 BI-LAMINATE (DRAPES) ×3 IMPLANT
DRAPE U-SHAPE 47X51 STRL (DRAPES) ×2 IMPLANT
DURAPREP 26ML APPLICATOR (WOUND CARE) ×11 IMPLANT
DW OUTFLOW CASSETTE/TUBE SET (MISCELLANEOUS) ×2 IMPLANT
ELECT REM PT RETURN 9FT ADLT (ELECTROSURGICAL) ×3
ELECTRODE REM PT RTRN 9FT ADLT (ELECTROSURGICAL) ×1 IMPLANT
GAUZE PETRO XEROFOAM 1X8 (MISCELLANEOUS) ×3 IMPLANT
GAUZE SPONGE 4X4 12PLY STRL (GAUZE/BANDAGES/DRESSINGS) ×6 IMPLANT
GLOVE BIOGEL PI IND STRL 9 (GLOVE) ×1 IMPLANT
GLOVE BIOGEL PI INDICATOR 9 (GLOVE) ×2
GLOVE SURG 9.0 ORTHO LTXF (GLOVE) ×6 IMPLANT
GLOVE SURG SYN 9.0  PF PI (GLOVE) ×6
GLOVE SURG SYN 9.0 PF PI (GLOVE) IMPLANT
GOWN STRL REUS TWL 2XL XL LVL4 (GOWN DISPOSABLE) ×3 IMPLANT
GOWN STRL REUS W/ TWL LRG LVL3 (GOWN DISPOSABLE) ×1 IMPLANT
GOWN STRL REUS W/ TWL LRG LVL4 (GOWN DISPOSABLE) ×1 IMPLANT
GOWN STRL REUS W/TWL LRG LVL3 (GOWN DISPOSABLE) ×3
GOWN STRL REUS W/TWL LRG LVL4 (GOWN DISPOSABLE) ×6
IV LACTATED RINGER IRRG 3000ML (IV SOLUTION) ×24
IV LR IRRIG 3000ML ARTHROMATIC (IV SOLUTION) ×6 IMPLANT
KIT STABILIZATION SHOULDER (MISCELLANEOUS) ×3 IMPLANT
KIT SUTURE 2.8 Q-FIX DISP (MISCELLANEOUS) ×2 IMPLANT
KIT SUTURETAK 3.0 INSERT PERC (KITS) IMPLANT
KIT TURNOVER KIT A (KITS) ×3 IMPLANT
MANIFOLD NEPTUNE II (INSTRUMENTS) ×3 IMPLANT
MASK FACE SPIDER DISP (MASK) ×3 IMPLANT
MAT BLUE FLOOR 46X72 FLO (MISCELLANEOUS) ×6 IMPLANT
NDL SAFETY ECLIPSE 18X1.5 (NEEDLE) ×1 IMPLANT
NEEDLE HYPO 18GX1.5 SHARP (NEEDLE) ×3
NEEDLE HYPO 22GX1.5 SAFETY (NEEDLE) ×3 IMPLANT
NS IRRIG 500ML POUR BTL (IV SOLUTION) ×5 IMPLANT
PACK ARTHROSCOPY SHOULDER (MISCELLANEOUS) ×3 IMPLANT
PAD WRAPON POLAR SHDR XLG (MISCELLANEOUS) ×1 IMPLANT
PASSER SUT CAPTURE FIRST (SUTURE) ×2 IMPLANT
PORT APPOLLO RF 90DEGREE MULTI (SURGICAL WAND) ×2 IMPLANT
SET ADAPTER IRRIG ARTHO 72IN Y (ADAPTER) ×2 IMPLANT
SET TUBE SUCT SHAVER OUTFL 24K (TUBING) ×1 IMPLANT
SET TUBE TIP INTRA-ARTICULAR (MISCELLANEOUS) ×1 IMPLANT
STRAP SAFETY 5IN WIDE (MISCELLANEOUS) ×3 IMPLANT
STRIP CLOSURE SKIN 1/2X4 (GAUZE/BANDAGES/DRESSINGS) ×4 IMPLANT
SUT ETHILON 4-0 (SUTURE) ×6
SUT ETHILON 4-0 FS2 18XMFL BLK (SUTURE) ×2
SUT LASSO 90 DEG SD STR (SUTURE) IMPLANT
SUT MNCRL 4-0 (SUTURE) ×3
SUT MNCRL 4-0 27XMFL (SUTURE) ×1
SUT PDS AB 0 CT1 27 (SUTURE) ×3 IMPLANT
SUT PERFECTPASSER WHITE CART (SUTURE) ×8 IMPLANT
SUT SMART STITCH CARTRIDGE (SUTURE) ×6 IMPLANT
SUT VIC AB 0 CT1 36 (SUTURE) ×3 IMPLANT
SUT VIC AB 2-0 CT2 27 (SUTURE) ×3 IMPLANT
SUTURE ETHLN 4-0 FS2 18XMF BLK (SUTURE) ×1 IMPLANT
SUTURE MAGNUM WIRE 2X48 BLK (SUTURE) IMPLANT
SUTURE MNCRL 4-0 27XMF (SUTURE) ×1 IMPLANT
SYR 10ML 18GX1 1/2 (NEEDLE) ×2 IMPLANT
SYR 10ML LL (SYRINGE) ×3 IMPLANT
SYR 3ML 18GX1 1/2 (SYRINGE) ×2 IMPLANT
TAPE MICROFOAM 4IN (TAPE) ×3 IMPLANT
TUBING ARTHRO INFLOW-ONLY STRL (TUBING) ×1 IMPLANT
TUBING CONNECTING 10 (TUBING) ×2 IMPLANT
TUBING CONNECTING 10' (TUBING) ×1
TUBING REDEUCE PAT W/CON 8IN (MISCELLANEOUS) ×2 IMPLANT
TUBING REDEUCE PUMP W/CON 8IN (MISCELLANEOUS) ×2 IMPLANT
WAND HAND CNTRL MULTIVAC 90 (MISCELLANEOUS) ×1 IMPLANT
WRAPON POLAR PAD SHDR XLG (MISCELLANEOUS) ×3

## 2018-02-10 NOTE — Progress Notes (Signed)
Duo neb given  Oxygen sat on room air 88

## 2018-02-10 NOTE — Transfer of Care (Signed)
Immediate Anesthesia Transfer of Care Note  Patient: Scott Quinn  Procedure(s) Performed: SHOULDER ARTHROSCOPY WITH MINI OPEN ROTATOR CUFF REPAIR,DISTAL CLAVICAL EXCISION, SUBACROMINAL DECOMPRESSION, AND DEBRIDEMENT (Left Shoulder)  Patient Location: PACU  Anesthesia Type:GA combined with regional for post-op pain  Level of Consciousness: sedated  Airway & Oxygen Therapy: Patient Spontanous Breathing and Patient connected to face mask oxygen  Post-op Assessment: Report given to RN and Post -op Vital signs reviewed and stable  Post vital signs: Reviewed  Last Vitals:  Vitals Value Taken Time  BP 115/67 02/10/2018  3:00 PM  Temp 36.3 C 02/10/2018  2:59 PM  Pulse 90 02/10/2018  3:00 PM  Resp 15 02/10/2018  3:00 PM  SpO2 97 % 02/10/2018  3:00 PM    Last Pain:  Vitals:   02/10/18 1100  TempSrc:   PainSc: 0-No pain         Complications: No apparent anesthesia complications

## 2018-02-10 NOTE — Discharge Instructions (Addendum)
AMBULATORY SURGERY  DISCHARGE INSTRUCTIONS   1) The drugs that you were given will stay in your system until tomorrow so for the next 24 hours you should not:  A) Drive an automobile B) Make any legal decisions C) Drink any alcoholic beverage   2) You may resume regular meals tomorrow.  Today it is better to start with liquids and gradually work up to solid foods.   You may eat anything you prefer, but it is better to start with liquids, then soup and crackers, and gradually work up to solid foods.   3) Please notify your doctor immediately if you have any unusual bleeding, trouble breathing, redness and pain at the surgery site, drainage, fever, or pain not relieved by medication.    4) Additional Instructions:        Please contact your physician with any problems or Same Day Surgery at [phone], Monday through Friday 6 am to 4 pm, or [place] [facility] [place] [facility] number at [phone].   Interscalene Nerve Block (ISNB) Discharge Instructions   1.  For your surgery you have received an Interscalene Nerve Block. 2. Nerve Blocks affect many types of nerves, including nerves that control movement, pain and normal sensation.  You may experience feelings such as numbness, tingling, heaviness, weakness or the inability to move your arm or the feeling or sensation that your arm has "fallen asleep". 3. A nerve block can last for 2 - 36 hours or more depending on the medication used.  Usually the weakness wears off first.  The tingling and heaviness usually wear off next.  Finally you may start to notice pain.  Keep in mind that this may occur in any order.  once a nerve block starts to wear off it is usually completely gone within 60 minutes. 4. ISNB may cause mild shortness of breath, a hoarse voice, blurry vision, unequal pupils, or drooping of the face on the same side as the nerve block.  These symptoms will usually go away within 12 hours.  Very rarely the procedure itself can  cause mild seizures. 5. If needed, your surgeon will give you a prescription for pain medication.  It will take about 60 minutes for the oral pain medication to become fully effective.  So, it is recommended that you start taking this medication before the nerve block first begins to wear off, or when you first begin to feel discomfort. 6. Keep in mind that nerve blocks often wear off in the middle of the night.   If you are going to bed and the block has not started to wear off or you have not started to have any discomfort, consider setting an alarm for 2 to 3 hours, so you can assess your block.  If you notice the block is wearing off or you are starting to have discomfort, you can take your pain medication. 7. Take your pain medication only as prescribed.  Pain medication can cause sedation and decrease your breathing if you take more than you need for the level of pain that you have. 8. Nausea is a common side effect of many pain medications.  You may want to eat something before taking your pain medicine to prevent nausea. 9. After an Interscalene nerve block, you cannot feel pain, pressure or extremes in temperature in the effected arm.  Because your arm is numb it is at an increased risk for injury.  To decrease the possibility of injury, please practice the following:  a. While   you are awake change the position of your arm frequently to prevent too much pressure on any one area for prolonged periods of time. b.  If you have a cast or tight dressing, check the color or your fingers every couple of hours.  Call your surgeon with the appearance of any discoloration (white or blue). c. If you are given a sling to wear before you go home, please wear it  at all times until the block has completely worn off.  Do not get up at night without your sling. d. If you experience any problems or concerns, please contact your surgeon's office. e. If you experience severe or prolonged shortness of breath go to  the nearest emergency department. 

## 2018-02-10 NOTE — Progress Notes (Signed)
Incentive spiratory done for low sats   Oxygen sat on 2 liters 94

## 2018-02-10 NOTE — Progress Notes (Signed)
Oxygen low on room air 90 to 94   Dr Noralyn Pickcarroll in to see pt several times

## 2018-02-10 NOTE — Anesthesia Post-op Follow-up Note (Signed)
Anesthesia QCDR form completed.        

## 2018-02-10 NOTE — Op Note (Signed)
02/10/2018  3:09 PM  PATIENT:  Scott Quinn  57 y.o. male  PRE-OPERATIVE DIAGNOSIS:  LEFT SHOULDER FULL THICKNESS ROTATOR CUFF TEAR, SPONTANEOUS LONG HEAD OF BICEPS RUPTURE, SUBACROMIAL IMPINGEMENT AND AC JOINT ARTHROSIS, SUPERIOR LABRAL TEAR  POST-OPERATIVE DIAGNOSIS: SAME  PROCEDURE:  Procedure(s): LEFT SHOULDER ARTHROSCOPY WITH MINI OPEN ROTATOR CUFF REPAIR,DISTAL CLAVICAL EXCISION, SUBACROMINAL DECOMPRESSION, AND LABRAL DEBRIDEMENT   SURGEON:  Surgeon(s) and Role:    Thornton Park, MD - Primary  ANESTHESIA:   local, general and paracervical block   PREOPERATIVE INDICATIONS:  Scott Quinn is a  57 y.o. male with a diagnosis of FULL THICKNESS ROTATOR CUFF TEAR LEFT failed conservative treatment and elected for surgical management.    The risks benefits and alternatives were discussed with the patient preoperatively including but not limited to the risks of infection, bleeding, nerve injury, persistent pain or weakness, failure of the hardware, re-tear of the rotator cuff and the need for further surgery. Medical risks include DVT and pulmonary embolism, myocardial infarction, stroke, pneumonia, respiratory failure and death. Patient understood these risks and wished to proceed.  OPERATIVE IMPLANTS: [place] Multifix anchors x 2 & [employer] Q Fix anchors x 2  OPERATIVE FINDINGS: Full-thickness and retracted tears of the supra and infraspinatus, with spontaneous rupture of the long head of the biceps tendon, AC joint arthrosis, subacromial burning causing impingement, sensitive tearing of the superior labrum anterior to posterior.  OPERATIVE PROCEDURE: The patient was met in the preoperative area. The left shoulder was signed with the word yes and my initials according the hospital's correct site of surgery protocol.   History and physical was updated.  Patient underwent an interscalene block by the anesthesia service.  Patient was brought to the operating room  where he underwent general anesthesia.  The patient was placed in a beachchair position.  A spider arm positioner was used for this case. Examination under anesthesia revealed no limitation of motion or instability with load shift testing. The patient had a negative sulcus sign.  Patient was prepped and draped in a sterile fashion. A timeout was performed to verify the patient's name, date of birth, medical record number, correct site of surgery and correct procedure to be performed there was also used to verify the patient received antibiotics that all appropriate instruments, implants and radiographs studies were available in the room. Once all in attendance were in agreement case began.  Bony landmarks were drawn out with a surgical marker along with proposed arthroscopy incisions. These were pre-injected with 1% lidocaine plain. An 11 blade was used to establish a posterior portal through which the arthroscope was placed in the glenohumeral joint. A full diagnostic examination of the shoulder was performed.  Patient had extensive tearing of the superior labrum.  This was debrided with a 4.0 resector shaver blade.  The patient had already spontaneously ruptured his long head of the biceps tendon.  This was not visualized.  He had a partial tear of the subscapularis without retraction.  The frayed fibers of the upper subscapularis were debrided with a 4 oh resector shaver blade.  Patient had a full-thickness chondral lesion of the superior aspect of the humeral head measuring approximately 8x39m.  Chondroplasty was performed of this lesion to debride loose chondral flaps.    The arthroscope was then placed in the subacromial space.  Extensive bursitis was encountered and debrided using a 4-0 resector shaver blade and a 90 ArthroCare wand from a lateral portal which was established  under direct visualization using an 18-gauge spinal needle. A subacromial decompression was also performed using a 5.5 mm  resector shaver blade from the lateral portal. A 5.5 mm resector shaver blade was then used to debride the greater tuberosity of all torn fibers of the rotator cuff.   A distal clavicle excision was then performed with a 5.5 mm resector shaver blade from a secondary anterior portal.  Three perfect Pass suture were placed in the lateral border of the rotator cuff tear. All arthroscopic instruments were then removed and the mini-open portion of the procedure began.  A saber-type incision was made along the lateral border of the acromion. The deltoid muscle was identified and split in line with its fibers which allowed visualization of the rotator cuff. The Perfect Pass sutures previously placed in the lateral border of the rotator cuff were brought out through the deltoid split.   Two [employer] anchor was placed at the articular margin of the humeral head with the greater tuberosity. The suture limbs of the Q Fix anchor were passed medially through the rotator cuff using a First Pass suture passer.   Additional perfect suture was placed in the lateral border of the rotator cuff for a total of 4.  The four Perfect Pass sutures were then anchored to the greater tuberosity footprint using two [place] multifix anchors. These anchors were tensioned to allow for anatomic reduction of the rotator cuff to the greater tuberosity. The medial row sutures were then tied down using an arthroscopic knot tying technique.  Arthroscopic images of the repair were taken with the arthroscope both externally and from inside the glenohumeral joint.  All incisions were copiously irrigated. The deltoid fascia was repaired using a 0 Vicryl suture.  The subcutaneous tissue of all incisions were closed with a 2-0 Vicryl. Skin closure for the arthroscopic incisions was performed with 4-0 nylon. The skin edges of the saber incision was approximated with a running 4-0 undyed Monocryl.  0.25% Marcaine plain was then injected  into the subacromial space for postoperative pain control. A dry sterile dressing was applied.  The patient was placed in an abduction sling and a Polar Care was applied to the shoulder.  All sharp and it instrument counts were correct at the conclusion of the case. I was scrubbed and present for the entire case. I spoke with the patient's brother and girlfriend postoperatively in the surgical consultation room to let them know the case had gone without complication and the patient was stable in recovery room.

## 2018-02-10 NOTE — Anesthesia Procedure Notes (Signed)
Procedure Name: Intubation Date/Time: 02/10/2018 11:21 AM Performed by: Christain Sacramento, RN Pre-anesthesia Checklist: Patient identified, Patient being monitored, Timeout performed, Emergency Drugs available and Suction available Patient Re-evaluated:Patient Re-evaluated prior to induction Oxygen Delivery Method: Circle system utilized Preoxygenation: Pre-oxygenation with 100% oxygen Induction Type: IV induction Ventilation: Mask ventilation without difficulty Laryngoscope Size: Mac and 4 Grade View: Grade I Tube type: Oral Tube size: 7.5 mm Number of attempts: 1 Airway Equipment and Method: Stylet Placement Confirmation: ETT inserted through vocal cords under direct vision,  positive ETCO2 and breath sounds checked- equal and bilateral Secured at: 23 cm Tube secured with: Tape Dental Injury: Teeth and Oropharynx as per pre-operative assessment

## 2018-02-10 NOTE — H&P (Signed)
The patient has been re-examined, and the chart reviewed, and there have been no interval changes to the documented history and physical.    The risks, benefits, and alternatives have been discussed at length, and the patient is willing to proceed.   

## 2018-02-10 NOTE — Anesthesia Procedure Notes (Signed)
Anesthesia Regional Block: Interscalene brachial plexus block   Pre-Anesthetic Checklist: ,, timeout performed, Correct Patient, Correct Site, Correct Laterality, Correct Procedure, Correct Position, site marked, Risks and benefits discussed,  Surgical consent,  Pre-op evaluation,  At surgeon's request and post-op pain management  Laterality: Upper and Left  Prep: chloraprep       Needles:  Injection technique: Single-shot  Needle Type: Stimiplex     Needle Length: 5cm  Needle Gauge: 22     Additional Needles:   Procedures:,,,, ultrasound used (permanent image in chart),,,,  Narrative:  Start time: 02/10/2018 10:37 AM End time: 02/10/2018 10:40 AM Injection made incrementally with aspirations every 5 mL.  Performed by: Personally  Anesthesiologist: Raeleigh Guinn, Cleda MccreedyJoseph K, MD  Additional Notes: Functioning IV was confirmed and monitors were applied.  A 50mm 22ga Stimuplex needle was used. Sterile prep,hand hygiene and sterile gloves were used.  Minimal sedation used for procedure.  No paresthesia endorsed by patient during the procedure.  Negative aspiration and negative test dose prior to incremental administration of local anesthetic. The patient tolerated the procedure well with no immediate complications.

## 2018-02-10 NOTE — Anesthesia Preprocedure Evaluation (Signed)
Anesthesia Evaluation  Patient identified by MRN, date of birth, ID band Patient awake    Reviewed: Allergy & Precautions, H&P , NPO status , Patient's Chart, lab work & pertinent test results  History of Anesthesia Complications Negative for: history of anesthetic complications  Airway Mallampati: III  TM Distance: <3 FB Neck ROM: full    Dental  (+) Chipped, Poor Dentition   Pulmonary neg shortness of breath, Current Smoker,           Cardiovascular Exercise Tolerance: Good (-) angina+ Peripheral Vascular Disease  (-) Past MI and (-) DOE      Neuro/Psych PSYCHIATRIC DISORDERS negative neurological ROS     GI/Hepatic negative GI ROS, Neg liver ROS, neg GERD  ,  Endo/Other  diabetes, Type 2  Renal/GU      Musculoskeletal   Abdominal   Peds  Hematology negative hematology ROS (+)   Anesthesia Other Findings Past Medical History: No date: Costochondritis No date: Diabetes mellitus without complication (HCC)     Comment:  NO MEDS No date: DVT (deep venous thrombosis) (HCC)     Comment:  LAST DVT IN 2018  Past Surgical History: No date: BACK SURGERY     Comment:  LOWER 07/19/2016: CARDIAC CATHETERIZATION; N/A     Comment:  Procedure: Intravascular Ultrasound/IVUS;  Surgeon:               Sherren Kernsharles E Fields, MD;  Location: [facility] INVASIVE CV LAB;                Service: Cardiovascular;  Laterality: N/A; No date: COLONOSCOPY No date: FOOT SURGERY     Comment:  X2 No date: INNER EAR SURGERY No date: KNEE SURGERY No date: NASAL SEPTUM SURGERY 07/19/2016: PERIPHERAL VASCULAR CATHETERIZATION; Left     Comment:  Procedure: Lower Extremity Venography;  Surgeon: Sherren Kernsharles              E Fields, MD;  Location: [facility] INVASIVE CV LAB;  Service:               Cardiovascular;  Laterality: Left; 07/19/2016: PERIPHERAL VASCULAR CATHETERIZATION     Comment:  Procedure: Thrombectomy;  Surgeon: Sherren Kernsharles E Fields, MD;              [facility];  Service: Cardiovascular;;               left venous system   BMI    Body Mass Index:  29.16 kg/m      Reproductive/Obstetrics negative OB ROS                             Anesthesia Physical Anesthesia Plan  ASA: III  Anesthesia Plan: General ETT   Post-op Pain Management: GA combined w/ Regional for post-op pain   Induction: Intravenous  PONV Risk Score and Plan: Ondansetron, Dexamethasone and Midazolam  Airway Management Planned: Oral ETT  Additional Equipment:   Intra-op Plan:   Post-operative Plan: Extubation in OR  Informed Consent: I have reviewed the patients History and Physical, chart, labs and discussed the procedure including the risks, benefits and alternatives for the proposed anesthesia with the patient or authorized representative who has indicated his/her understanding and acceptance.   Dental Advisory Given  Plan Discussed with: Anesthesiologist, CRNA and Surgeon  Anesthesia Plan Comments: (Patient consented for risks of anesthesia including but not limited to:  - adverse reactions to medications - damage to teeth,  lips or other oral mucosa - sore throat or hoarseness - Damage to heart, brain, lungs or loss of life  Patient voiced understanding.)        Anesthesia Quick Evaluation

## 2018-02-11 ENCOUNTER — Encounter: Payer: Self-pay | Admitting: Orthopedic Surgery

## 2018-02-11 NOTE — Anesthesia Postprocedure Evaluation (Signed)
Anesthesia Post Note  Patient: Scott Quinn  Procedure(s) Performed: SHOULDER ARTHROSCOPY WITH MINI OPEN ROTATOR CUFF REPAIR,DISTAL CLAVICAL EXCISION, SUBACROMINAL DECOMPRESSION, AND DEBRIDEMENT (Left Shoulder)  Patient location during evaluation: PACU Anesthesia Type: General Level of consciousness: awake and alert Pain management: pain level controlled Vital Signs Assessment: post-procedure vital signs reviewed and stable Respiratory status: spontaneous breathing, nonlabored ventilation, respiratory function stable and patient connected to nasal cannula oxygen Cardiovascular status: blood pressure returned to baseline and stable Postop Assessment: no apparent nausea or vomiting Anesthetic complications: no     Last Vitals:  Vitals:   02/10/18 1653 02/10/18 1655  BP: 99/82 119/76  Pulse: 98   Resp: 16   Temp:    SpO2: 95%     Last Pain:  Vitals:   02/11/18 0915  TempSrc:   PainSc: 1                  Scott Quinn

## 2018-02-26 ENCOUNTER — Other Ambulatory Visit: Payer: Self-pay | Admitting: *Deleted

## 2018-02-26 DIAGNOSIS — I824Z9 Acute embolism and thrombosis of unspecified deep veins of unspecified distal lower extremity: Secondary | ICD-10-CM

## 2018-02-26 MED ORDER — RIVAROXABAN 20 MG PO TABS: ORAL_TABLET | ORAL | 3 refills | Status: DC

## 2018-03-04 DIAGNOSIS — M25619 Stiffness of unspecified shoulder, not elsewhere classified: Secondary | ICD-10-CM | POA: Insufficient documentation

## 2018-03-04 DIAGNOSIS — M25519 Pain in unspecified shoulder: Secondary | ICD-10-CM | POA: Insufficient documentation

## 2018-03-04 DIAGNOSIS — R600 Localized edema: Secondary | ICD-10-CM | POA: Insufficient documentation

## 2018-04-30 IMAGING — DX DG CHEST 1V PORT
1 series · 1 of 1 positions shown · non-contrast
Comparison: 05/14/2011

CLINICAL DATA: Cough, shortness of Breath

EXAM:
PORTABLE CHEST 1 VIEW

[chest ap]
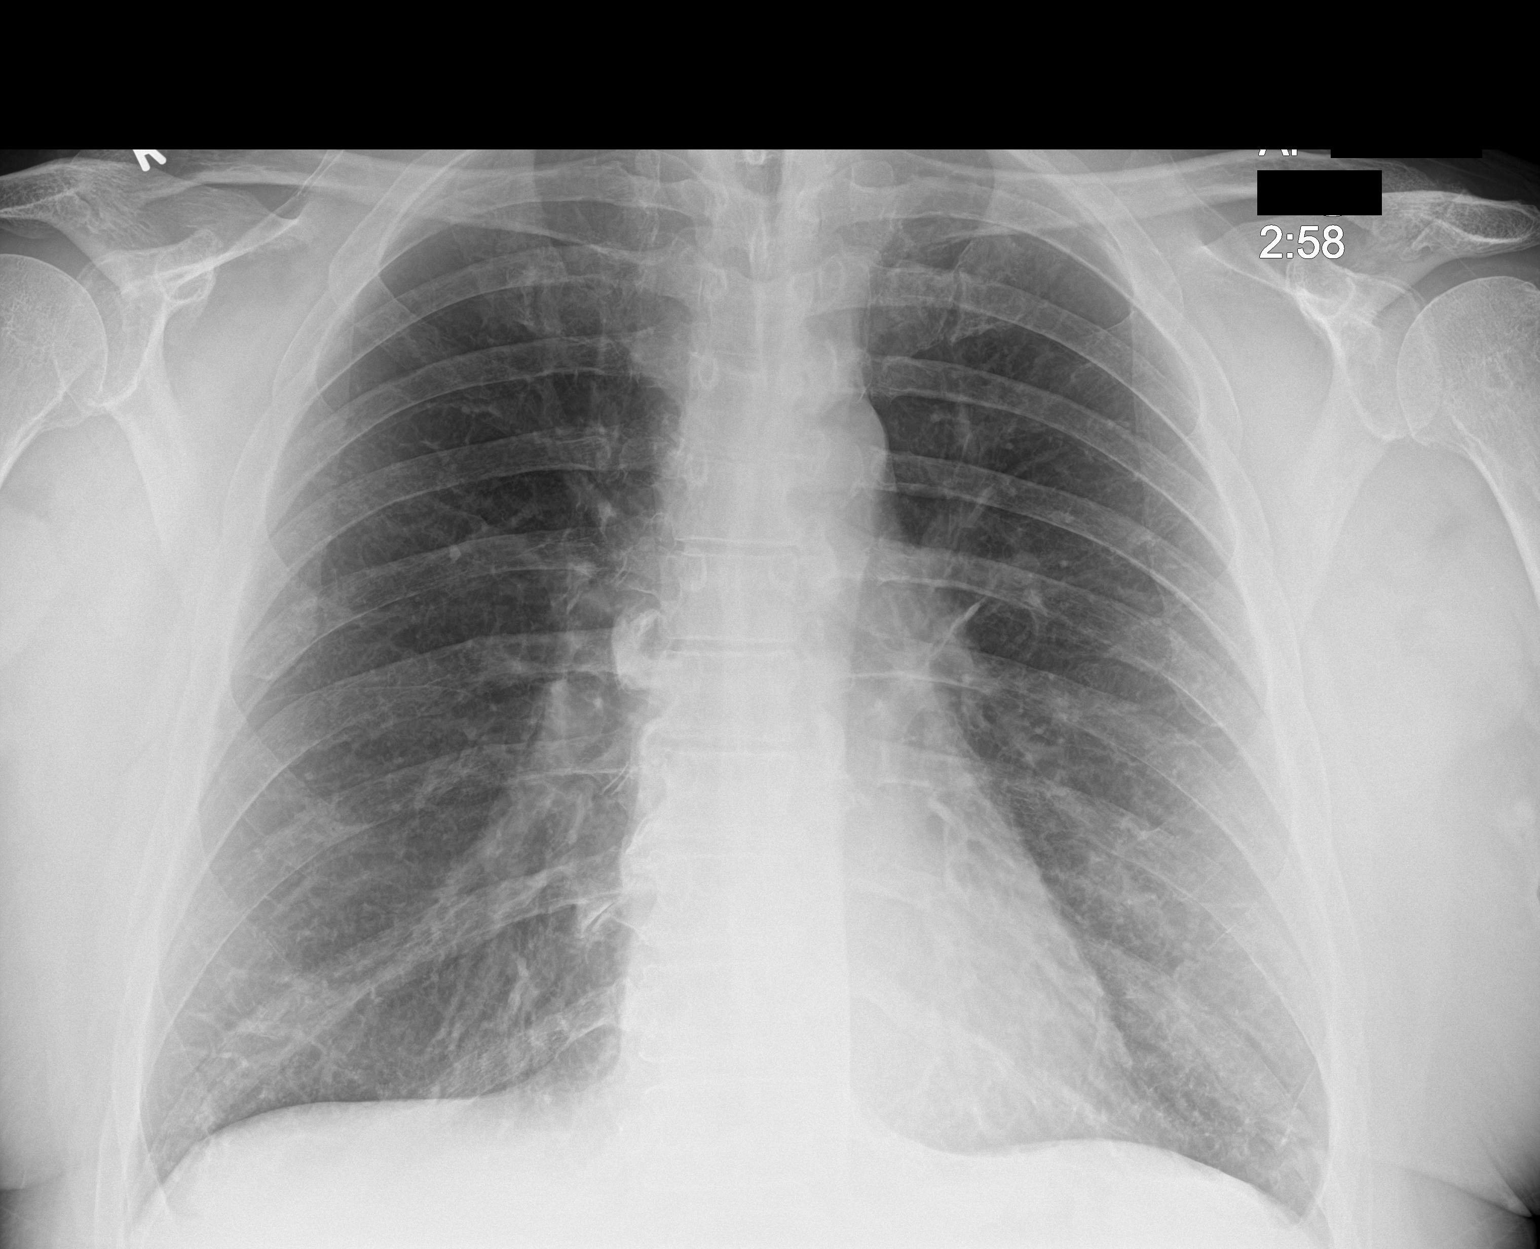

[1 of 1 positions shown; findings below may reference images not displayed]

FINDINGS: Heart and mediastinal contours are within normal limits. No focal
opacities or effusions. No acute bony abnormality.
IMPRESSION: No active cardiopulmonary disease.

## 2018-07-01 ENCOUNTER — Other Ambulatory Visit: Payer: Self-pay | Admitting: *Deleted

## 2018-07-01 DIAGNOSIS — I824Z9 Acute embolism and thrombosis of unspecified deep veins of unspecified distal lower extremity: Secondary | ICD-10-CM

## 2018-07-01 MED ORDER — RIVAROXABAN 20 MG PO TABS: ORAL_TABLET | ORAL | 3 refills | Status: DC

## 2018-12-25 ENCOUNTER — Other Ambulatory Visit: Payer: Self-pay | Admitting: Surgery

## 2018-12-25 DIAGNOSIS — I824Z9 Acute embolism and thrombosis of unspecified deep veins of unspecified distal lower extremity: Secondary | ICD-10-CM

## 2019-04-15 ENCOUNTER — Other Ambulatory Visit: Payer: Self-pay

## 2019-04-15 DIAGNOSIS — E119 Type 2 diabetes mellitus without complications: Secondary | ICD-10-CM | POA: Diagnosis not present

## 2019-04-15 DIAGNOSIS — L509 Urticaria, unspecified: Secondary | ICD-10-CM | POA: Insufficient documentation

## 2019-04-15 DIAGNOSIS — F17210 Nicotine dependence, cigarettes, uncomplicated: Secondary | ICD-10-CM | POA: Insufficient documentation

## 2019-04-15 DIAGNOSIS — Z7901 Long term (current) use of anticoagulants: Secondary | ICD-10-CM | POA: Diagnosis not present

## 2019-04-15 DIAGNOSIS — I82409 Acute embolism and thrombosis of unspecified deep veins of unspecified lower extremity: Secondary | ICD-10-CM | POA: Diagnosis not present

## 2019-04-15 MED ORDER — PREDNISONE 20 MG PO TABS
60.0000 mg | ORAL_TABLET | Freq: Once | ORAL | Status: AC
  Administered 2019-04-15: 60 mg via ORAL
  Filled 2019-04-15: qty 3

## 2019-04-15 MED ORDER — FAMOTIDINE 20 MG PO TABS
20.0000 mg | ORAL_TABLET | Freq: Once | ORAL | Status: AC
  Administered 2019-04-15: 23:00:00 20 mg via ORAL
  Filled 2019-04-15: qty 1

## 2019-04-15 NOTE — ED Triage Notes (Signed)
Pt states started getting hives at 2100 and has had 100 mg of benadryl since. States has hx of the same, unsure of cause but benadryl normally helps. No swelling to tongue or mouth at this time.

## 2019-04-16 ENCOUNTER — Emergency Department
Admission: EM | Admit: 2019-04-16 | Discharge: 2019-04-16 | Disposition: A | Discharge status: Home / Self Care | Payer: BC Managed Care – PPO | Attending: Emergency Medicine | Admitting: Emergency Medicine

## 2019-04-16 DIAGNOSIS — L509 Urticaria, unspecified: Secondary | ICD-10-CM

## 2019-04-16 MED ORDER — PREDNISONE 20 MG PO TABS: 60.0000 mg | ORAL_TABLET | Freq: Every day | ORAL | 0 refills | Status: AC

## 2019-04-16 MED ORDER — EPINEPHRINE 0.3 MG/0.3ML IJ SOAJ: 0.3000 mg | INTRAMUSCULAR | 0 refills | Status: AC | PRN

## 2019-04-16 NOTE — ED Provider Notes (Signed)
[facility] Emergency Department Provider Note   First MD Initiated Contact with Patient 04/16/19 [phone]     (approximate)  I have reviewed the triage vital signs and the nursing notes.   HISTORY  Chief Complaint Urticaria    HPI Scott Quinn is a 58 y.o. male with below list of previous medical conditions including previous anaphylactic reaction presents to the emergency department secondary to pruritic hives which patient states began at his waistline and spread diffusely.  Patient states that symptoms began at 9 PM tonight.  Patient states that he took 100 mg of Benadryl over1 hour  without any improvement.  Patient denies any difficulty breathing or swallowing.  Patient denies any oral swelling.        Past Medical History:  Diagnosis Date  . Costochondritis   . Diabetes mellitus without complication (HCC)    NO MEDS  . DVT (deep venous thrombosis) (HCC)    LAST DVT IN 2018    Patient Active Problem List   Diagnosis Date Noted  . Type 2 diabetes mellitus (HCC) 07/03/2015  . Alcohol abuse 06/13/2015  . Colon cancer screening 03/03/2014  . Encounter for therapeutic drug monitoring 10/28/2013  . DVT (deep venous thrombosis) (HCC) 10/07/2013  . DVT of lower limb, acute (HCC) 09/22/2013  . Tobacco use 09/22/2013    Past Surgical History:  Procedure Laterality Date  . BACK SURGERY     LOWER  . CARDIAC CATHETERIZATION N/A 07/19/2016   Procedure: Intravascular Ultrasound/IVUS;  Surgeon: Sherren Kerns, MD;  Location: [facility] INVASIVE CV LAB;  Service: Cardiovascular;  Laterality: N/A;  . COLONOSCOPY    . FOOT SURGERY     X2  . INNER EAR SURGERY    . KNEE SURGERY    . NASAL SEPTUM SURGERY    . PERIPHERAL VASCULAR CATHETERIZATION Left 07/19/2016   Procedure: Lower Extremity Venography;  Surgeon: Sherren Kerns, MD;  Location: [facility] INVASIVE CV LAB;  Service: Cardiovascular;  Laterality: Left;  . PERIPHERAL VASCULAR CATHETERIZATION  07/19/2016   Procedure: Thrombectomy;  Surgeon: Sherren Kerns, MD;  Location: [facility] INVASIVE CV LAB;  Service: Cardiovascular;;  left venous system   . SHOULDER ARTHROSCOPY WITH OPEN ROTATOR CUFF REPAIR Left 02/10/2018   Procedure: SHOULDER ARTHROSCOPY WITH MINI OPEN ROTATOR CUFF REPAIR,DISTAL CLAVICAL EXCISION, SUBACROMINAL DECOMPRESSION, AND DEBRIDEMENT;  Surgeon: Juanell Fairly, MD;  Location: [facility] ORS;  Service: Orthopedics;  Laterality: Left;    Prior to Admission medications   Medication Sig Start Date End Date Taking? Authorizing Provider  ondansetron (ZOFRAN) 4 MG tablet Take 1 tablet (4 mg total) by mouth every 8 (eight) hours as needed for nausea or vomiting. 02/10/18   Juanell Fairly, MD  oxyCODONE (OXY IR/ROXICODONE) 5 MG immediate release tablet Take 1 tablet (5 mg total) by mouth every 4 (four) hours as needed. 02/10/18   Juanell Fairly, MD  XARELTO 20 MG TABS tablet TAKE 1 TABLET BY MOUTH EVERY DAY 12/25/18   Chuck Hint, MD    Allergies Latex and Thimerosal  No family history on file.  Social History Social History   Tobacco Use  . Smoking status: Current Every Day Smoker    Packs/day: 1.00    Years: 15.00    Pack years: 15.00    Types: Cigarettes  . Smokeless tobacco: Never Used  Substance Use Topics  . Alcohol use: Yes    Alcohol/week: 8.0 standard drinks    Types: 8 Cans of beer per week    Comment:  6-8 DAILY  . Drug use: No    Review of Systems Constitutional: No fever/chills Eyes: No visual changes. ENT: No sore throat. Cardiovascular: Denies chest pain. Respiratory: Denies shortness of breath. Gastrointestinal: No abdominal pain.  No nausea, no vomiting.  No diarrhea.  No constipation. Genitourinary: Negative for dysuria. Musculoskeletal: Negative for neck pain.  Negative for back pain. Integumentary: Positive for hives Neurological: Negative for headaches, focal weakness or numbness.   ____________________________________________   PHYSICAL  EXAM:  VITAL SIGNS: ED Triage Vitals  Enc Vitals Group     BP 04/15/19 2259 (!) 167/68     Pulse Rate 04/15/19 2259 92     Resp 04/15/19 2259 20     Temp 04/15/19 2259 98.7 F (37.1 C)     Temp Source 04/15/19 2259 Oral     SpO2 04/15/19 2259 100 %     Weight 04/15/19 2300 104.3 kg (230 lb)     Height 04/15/19 2300 1.829 m (6')     Head Circumference --      Peak Flow --      Pain Score 04/15/19 2300 0     Pain Loc --      Pain Edu? --      Excl. in GC? --     Constitutional: Alert and oriented.  Eyes: Conjunctivae are normal.  Mouth/Throat: Mucous membranes are moist. Neck: No stridor.  No meningeal signs.   Cardiovascular: Normal rate, regular rhythm. Good peripheral circulation. Grossly normal heart sounds. Respiratory: Normal respiratory effort.  No retractions. Gastrointestinal: Soft and nontender. No distention.  Musculoskeletal: No lower extremity tenderness nor edema. No gross deformities of extremities. Neurologic:  Normal speech and language. No gross focal neurologic deficits are appreciated.  Skin: Hives noted to the lower extremity and abdomen Psychiatric: Mood and affect are normal. Speech and behavior are normal.    Procedures   ____________________________________________   INITIAL IMPRESSION / MDM / ASSESSMENT AND PLAN / ED COURSE  As part of my medical decision making, I reviewed the following data within the electronic MEDICAL RECORD NUMBER   58 year old male presented with above-stated history and physical exam consistent with allergic reaction.  Patient was given prednisone and Pepcid in the emergency department with resolution of rash.  Patient will be prescribed prednisone and an EpiPen for home.  Patient referred to Dr.Juengel (allergist) for further outpatient evaluation. ____________________________________________  FINAL CLINICAL IMPRESSION(S) / ED DIAGNOSES  Final diagnoses:  Hives     MEDICATIONS GIVEN DURING THIS VISIT:  Medications   predniSONE (DELTASONE) tablet 60 mg (60 mg Oral Given 04/15/19 2306)  famotidine (PEPCID) tablet 20 mg (20 mg Oral Given 04/15/19 2306)     ED Discharge Orders    None      *Please note:  Durenda GuthrieJoseph T Millstein was evaluated in Emergency Department on 04/16/2019 for the symptoms described in the history of present illness. He was evaluated in the context of the global COVID-19 pandemic, which necessitated consideration that the patient might be at risk for infection with the SARS-CoV-2 virus that causes COVID-19. Institutional protocols and algorithms that pertain to the evaluation of patients at risk for COVID-19 are in a state of rapid change based on information released by regulatory bodies including the CDC and federal and state organizations. These policies and algorithms were followed during the patient's care in the ED.  Some ED evaluations and interventions may be delayed as a result of limited staffing during the pandemic.*  Note:  This document was prepared  using Systems analyst and may include unintentional dictation errors.   Gregor Hams, MD 04/16/19 [phone]

## 2019-05-11 ENCOUNTER — Ambulatory Visit: Payer: BC Managed Care – PPO | Admitting: Surgery

## 2019-05-11 ENCOUNTER — Other Ambulatory Visit: Payer: Self-pay

## 2019-05-11 ENCOUNTER — Encounter: Payer: Self-pay | Admitting: Surgery

## 2019-05-11 VITALS — BP 148/89 | HR 98 | Temp 97.9°F | Resp 12 | Ht 72.0 in | Wt 242.6 lb

## 2019-05-11 DIAGNOSIS — K8020 Calculus of gallbladder without cholecystitis without obstruction: Secondary | ICD-10-CM

## 2019-05-11 NOTE — Progress Notes (Signed)
05/11/2019  History of Present Illness: Scott Quinn is a 58 y.o. male presenting for further evaluation of symptomatic cholelithiasis.  He was seen initially in the emergency room on 02/24/2017 with right upper quadrant pain and had an ultrasound that showed cholelithiasis.  Dr. Phoebe Quinn saw him in the office the next day on 8/14 and given his recent DVT and being on Xarelto, discussion was to wait a few weeks to further discuss the need for surgery.  However the patient did not keep any follow-up appointments.  He now presents to discuss further his symptoms.  Patient reports that since then he has had a few significant episodes and some other minor episodes of right upper quadrant pain.  Denies any nausea or vomiting with these episodes symptoms mainly pain in the right upper quadrant as well as radiation to the back.  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  Denies any fevers or chills.  He has been trying to keep a low-fat diet.  His main purpose for this visit was to make sure that he did not need surgery more urgently.  Of note, his most recent episode was 1 week ago.  Past Medical History: Past Medical History:  Diagnosis Date  . Costochondritis   . Diabetes mellitus without complication ([place])    NO MEDS  . DVT (deep venous thrombosis) ([place])    LAST DVT IN 2018     Past Surgical History: Past Surgical History:  Procedure Laterality Date  . BACK SURGERY     LOWER  . CARDIAC CATHETERIZATION N/A 07/19/2016   Procedure: Intravascular Ultrasound/IVUS;  Surgeon: Elam Dutch, MD;  Location: [facility] CV LAB;  Service: Cardiovascular;  Laterality: N/A;  . COLONOSCOPY    . FOOT SURGERY     X2  . INNER EAR SURGERY    . KNEE SURGERY    . NASAL SEPTUM SURGERY    . PERIPHERAL VASCULAR CATHETERIZATION Left 07/19/2016   Procedure: Lower Extremity Venography;  Surgeon: Elam Dutch, MD;  Location: [place] CV LAB;  Service: Cardiovascular;   Laterality: Left;  . PERIPHERAL VASCULAR CATHETERIZATION  07/19/2016   Procedure: Thrombectomy;  Surgeon: Elam Dutch, MD;  Location: [place] CV LAB;  Service: Cardiovascular;;  left venous system   . SHOULDER ARTHROSCOPY WITH OPEN ROTATOR CUFF REPAIR Left 02/10/2018   Procedure: SHOULDER ARTHROSCOPY WITH MINI OPEN ROTATOR CUFF REPAIR,DISTAL CLAVICAL EXCISION, SUBACROMINAL DECOMPRESSION, AND DEBRIDEMENT;  Surgeon: Thornton Park, MD;  Location: [facility] ORS;  Service: Orthopedics;  Laterality: Left;  . TONSILECTOMY, ADENOIDECTOMY, BILATERAL MYRINGOTOMY AND TUBES      Home Medications: Prior to Admission medications   Medication Sig Start Date End Date Taking? Authorizing Provider  EPINEPHrine 0.3 mg/0.3 mL IJ SOAJ injection Inject 0.3 mLs (0.3 mg total) into the muscle as needed for anaphylaxis. 04/16/19  Yes Gregor Hams, MD  XARELTO 20 MG TABS tablet TAKE 1 TABLET BY MOUTH EVERY DAY 12/25/18  Yes Angelia Mould, MD    Allergies: Allergies  Allergen Reactions  . Latex Shortness Of Breath and Swelling    HAD REACTION TO BAND IN UNDERWEAR AND TO EKG ELECTRODES  . Thimerosal Other (See Comments)    Redness     Review of Systems: Review of Systems  Constitutional: Negative for chills and fever.  Respiratory: Negative for shortness of breath.   Cardiovascular: Negative for chest pain.  Gastrointestinal: Positive for abdominal pain. Negative for constipation, diarrhea, nausea and vomiting.  Genitourinary: Negative for dysuria.    Physical Exam BP (!) 148/89   Pulse 98   Temp 97.9 F (36.6 C) (Temporal)   Resp 12   Ht 6' (1.829 m)   Wt 242 lb 9.6 oz (110 kg)   SpO2 98%   BMI 32.90 kg/m  CONSTITUTIONAL: No acute distress HEENT:  Normocephalic, atraumatic, extraocular motion intact. RESPIRATORY:  Lungs are clear, and breath sounds are equal bilaterally. Normal respiratory effort without pathologic use of accessory muscles. CARDIOVASCULAR: Heart is regular without  murmurs, gallops, or rubs. GI: The abdomen is soft, nondistended, with some mild discomfort in the right upper quadrant.  Negative Murphy's sign.  Patient has supraumbilical scar from prior colon surgery for polyp removal. NEUROLOGIC:  Motor and sensation is grossly normal.  Cranial nerves are grossly intact. PSYCH:  Alert and oriented to person, place and time. Affect is normal.  Labs/Imaging: None recently  Assessment and Plan: This is a 58 y.o. male with a history of symptomatic cholelithiasis.  Discussed with the patient that there is no particular right or wrong when it comes to the timing of gallbladder surgery.  At this point his symptoms have been ranging from minor to somewhat significant but the timing of surgery really is up to him.  Given his the frequency of the symptoms I would be recommending surgery for him.  However he would like to wait until things are more stable with COVID to minimize his exposure risk.  For now I think that's reasonable since his episodes are sporadic.  Discussed with him return precautions particularly if he were to have more severe right upper quadrant pain, fevers, chills.  Discussed with him that if he were to have a more severe episode he should present to the emergency room.  At that point if there are any issues or concerns for cholecystitis, we would end up admitting him stopping his Xarelto so we could do surgery safely.  Discussed with him the possibility of on the other extreme of severe illness that he may require a percutaneous drain instead by that this is such a frequent issue.  Also reminded him that in the future when he does want to schedule surgery, he should give [place] a call within a month of his planned date so that we can have an updated H&P office visit as well as discussed with his vascular surgeon regarding pausing his Xarelto for a few days for surgery itself.  Patient is in agreement with this plan and all of his questions have been  answered.  Face-to-face time spent with the patient and care providers was 25 minutes, with more than 50% of the time spent counseling, educating, and coordinating care of the patient.     Howie Ill, MD [facility]

## 2019-05-11 NOTE — Patient Instructions (Addendum)
If you decide to proceed with surgery please call our office and let us know.    We will need to send Anticoagulant Clearance to your vascular surgeon Dr. Fabienne Brunsharles Fields if you decide to have surgery.    Laparoscopic Cholecystectomy Laparoscopic cholecystectomy is surgery to remove the gallbladder. The gallbladder is a pear-shaped organ that lies beneath the liver on the right side of the body. The gallbladder stores bile, which is a fluid that helps the body to digest fats. Cholecystectomy is often done for inflammation of the gallbladder (cholecystitis). This condition is usually caused by a buildup of gallstones (cholelithiasis) in the gallbladder. Gallstones can block the flow of bile, which can result in inflammation and pain. In severe cases, emergency surgery may be required. This procedure is done though small incisions in your abdomen (laparoscopic surgery). A thin scope with a camera (laparoscope) is inserted through one incision. Thin surgical instruments are inserted through the other incisions. In some cases, a laparoscopic procedure may be turned into a type of surgery that is done through a larger incision (open surgery). Tell a health care provider about:  Any allergies you have.  All medicines you are taking, including vitamins, herbs, eye drops, creams, and over-the-counter medicines.  Any problems you or family members have had with anesthetic medicines.  Any blood disorders you have.  Any surgeries you have had.  Any medical conditions you have.  Whether you are pregnant or may be pregnant. What are the risks? Generally, this is a safe procedure. However, problems may occur, including:  Infection.  Bleeding.  Allergic reactions to medicines.  Damage to other structures or organs.  A stone remaining in the common bile duct. The common bile duct carries bile from the gallbladder into the small intestine.  A bile leak from the cyst duct that is clipped when your  gallbladder is removed. What happens before the procedure? Staying hydrated Follow instructions from your health care provider about hydration, which may include:  Up to 2 hours before the procedure - you may continue to drink clear liquids, such as water, clear fruit juice, black coffee, and plain tea. Eating and drinking restrictions Follow instructions from your health care provider about eating and drinking, which may include:  8 hours before the procedure - stop eating heavy meals or foods such as meat, fried foods, or fatty foods.  6 hours before the procedure - stop eating light meals or foods, such as toast or cereal.  6 hours before the procedure - stop drinking milk or drinks that contain milk.  2 hours before the procedure - stop drinking clear liquids. Medicines  Ask your health care provider about: ? Changing or stopping your regular medicines. This is especially important if you are taking diabetes medicines or blood thinners. ? Taking medicines such as aspirin and ibuprofen. These medicines can thin your blood. Do not take these medicines before your procedure if your health care provider instructs you not to.  You may be given antibiotic medicine to help prevent infection. General instructions  Let your health care provider know if you develop a cold or an infection before surgery.  Plan to have someone take you home from the hospital or clinic.  Ask your health care provider how your surgical site will be marked or identified. What happens during the procedure?   To reduce your risk of infection: ? Your health care team will wash or sanitize their hands. ? Your skin will be washed with soap. ?  Hair may be removed from the surgical area.  An IV tube may be inserted into one of your veins.  You will be given one or more of the following: ? A medicine to help you relax (sedative). ? A medicine to make you fall asleep (general anesthetic).  A breathing tube  will be placed in your mouth.  Your surgeon will make several small cuts (incisions) in your abdomen.  The laparoscope will be inserted through one of the small incisions. The camera on the laparoscope will send images to a TV screen (monitor) in the operating room. This lets your surgeon see inside your abdomen.  Air-like gas will be pumped into your abdomen. This will expand your abdomen to give the surgeon more room to perform the surgery.  Other tools that are needed for the procedure will be inserted through the other incisions. The gallbladder will be removed through one of the incisions.  Your common bile duct may be examined. If stones are found in the common bile duct, they may be removed.  After your gallbladder has been removed, the incisions will be closed with stitches (sutures), staples, or skin glue.  Your incisions may be covered with a bandage (dressing). The procedure may vary among health care providers and hospitals. What happens after the procedure?  Your blood pressure, heart rate, breathing rate, and blood oxygen level will be monitored until the medicines you were given have worn off.  You will be given medicines as needed to control your pain.  Do not drive for 24 hours if you were given a sedative. This information is not intended to replace advice given to you by your health care provider. Make sure you discuss any questions you have with your health care provider. Document Released: 07/01/2005 Document Revised: 06/13/2017 Document Reviewed: 12/18/2015 Elsevier Patient Education  2020 [employer] Gallbladder Eating Plan If you have a gallbladder condition, you may have trouble digesting fats. Eating a low-fat diet can help reduce your symptoms, and may be helpful before and after having surgery to remove your gallbladder (cholecystectomy). Your health care provider may recommend that you work with a diet and nutrition specialist (dietitian) to help you reduce  the amount of fat in your diet. What are tips for following this plan? General guidelines  Limit your fat intake to less than 30% of your total daily calories. If you eat around 1,800 calories each day, this is less than 60 grams (g) of fat per day.  Fat is an important part of a healthy diet. Eating a low-fat diet can make it hard to maintain a healthy body weight. Ask your dietitian how much fat, calories, and other nutrients you need each day.  Eat small, frequent meals throughout the day instead of three large meals.  Drink at least 8-10 cups of fluid a day. Drink enough fluid to keep your urine clear or pale yellow.  Limit alcohol intake to no more than 1 drink a day for nonpregnant women and 2 drinks a day for men. One drink equals 12 oz of beer, 5 oz of wine, or 1 oz of hard liquor. Reading food labels  Check Nutrition Facts on food labels for the amount of fat per serving. Choose foods with less than 3 grams of fat per serving. Shopping  Choose nonfat and low-fat healthy foods. Look for the words "nonfat," "low fat," or "fat free."  Avoid buying processed or prepackaged foods. Cooking  Cook using low-fat methods, such as baking, broiling,  grilling, or boiling.  Cook with small amounts of healthy fats, such as olive oil, grapeseed oil, canola oil, or sunflower oil. What foods are recommended?   All fresh, frozen, or canned fruits and vegetables.  Whole grains.  Low-fat or non-fat (skim) milk and yogurt.  Lean meat, skinless poultry, fish, eggs, and beans.  Low-fat protein supplement powders or drinks.  Spices and herbs. What foods are not recommended?  High-fat foods. These include baked goods, fast food, fatty cuts of meat, ice cream, french toast, sweet rolls, pizza, cheese bread, foods covered with butter, creamy sauces, or cheese.  Fried foods. These include french fries, tempura, battered fish, breaded chicken, fried breads, and sweets.  Foods with strong  odors.  Foods that cause bloating and gas. Summary  A low-fat diet can be helpful if you have a gallbladder condition, or before and after gallbladder surgery.  Limit your fat intake to less than 30% of your total daily calories. This is about 60 g of fat if you eat 1,800 calories each day.  Eat small, frequent meals throughout the day instead of three large meals. This information is not intended to replace advice given to you by your health care provider. Make sure you discuss any questions you have with your health care provider. Document Released: 07/06/2013 Document Revised: 10/22/2018 Document Reviewed: 08/08/2016 Elsevier Patient Education  2020 [employer]. Cholelithiasis  Cholelithiasis is also called "gallstones." It is a kind of gallbladder disease. The gallbladder is an organ that stores a liquid (bile) that helps you digest fat. Gallstones may not cause symptoms (may be silent gallstones) until they cause a blockage, and then they can cause pain (gallbladder attack). Follow these instructions at home:  Take over-the-counter and prescription medicines only as told by your doctor.  Stay at a healthy weight.  Eat healthy foods. This includes: ? Eating fewer fatty foods, like fried foods. ? Eating fewer refined carbs (refined carbohydrates). Refined carbs are breads and grains that are highly processed, like white bread and white rice. Instead, choose whole grains like whole-wheat bread and brown rice. ? Eating more fiber. Almonds, fresh fruit, and beans are healthy sources of fiber.  Keep all follow-up visits as told by your doctor. This is important. Contact a doctor if:  You have sudden pain in the upper right side of your belly (abdomen). Pain might spread to your right shoulder or your chest. This may be a sign of a gallbladder attack.  You feel sick to your stomach (are nauseous).  You throw up (vomit).  You have been diagnosed with gallstones that have no  symptoms and you get: ? Belly pain. ? Discomfort, burning, or fullness in the upper part of your belly (indigestion). Get help right away if:  You have sudden pain in the upper right side of your belly, and it lasts for more than 2 hours.  You have belly pain that lasts for more than 5 hours.  You have a fever or chills.  You keep feeling sick to your stomach or you keep throwing up.  Your skin or the whites of your eyes turn yellow (jaundice).  You have dark-colored pee (urine).  You have light-colored poop (stool). Summary  Cholelithiasis is also called "gallstones."  The gallbladder is an organ that stores a liquid (bile) that helps you digest fat.  Silent gallstones are gallstones that do not cause symptoms.  A gallbladder attack may cause sudden pain in the upper right side of your belly. Pain might  spread to your right shoulder or your chest. If this happens, contact your doctor.  If you have sudden pain in the upper right side of your belly that lasts for more than 2 hours, get help right away. This information is not intended to replace advice given to you by your health care provider. Make sure you discuss any questions you have with your health care provider. Document Released: 12/18/2007 Document Revised: 06/13/2017 Document Reviewed: 03/17/2016 Elsevier Patient Education  2020 [employer].

## 2019-09-28 ENCOUNTER — Other Ambulatory Visit: Payer: Self-pay | Admitting: Vascular Surgery

## 2019-09-28 DIAGNOSIS — I824Z9 Acute embolism and thrombosis of unspecified deep veins of unspecified distal lower extremity: Secondary | ICD-10-CM

## 2019-12-03 ENCOUNTER — Ambulatory Visit (INDEPENDENT_AMBULATORY_CARE_PROVIDER_SITE_OTHER): Payer: BLUE CROSS/BLUE SHIELD

## 2019-12-03 ENCOUNTER — Ambulatory Visit
Admission: EM | Admit: 2019-12-03 | Discharge: 2019-12-03 | Disposition: A | Discharge status: Home / Self Care | Payer: BLUE CROSS/BLUE SHIELD | Attending: Emergency Medicine | Admitting: Emergency Medicine

## 2019-12-03 DIAGNOSIS — L84 Corns and callosities: Secondary | ICD-10-CM

## 2019-12-03 DIAGNOSIS — M79671 Pain in right foot: Secondary | ICD-10-CM | POA: Diagnosis not present

## 2019-12-03 NOTE — ED Provider Notes (Signed)
[facility]    CSN: [id number] Arrival date & time: 12/03/19  1745      History   Chief Complaint Chief Complaint  Patient presents with  . foriegn object in rt foot    HPI Scott Quinn is a 59 y.o. male with history of DVT (on Xarelto), T2DM presenting for possible foreign body of right foot.  States he stepped on a seashell about 2 weeks ago.  Thought he retrieved most of it, though still having some pain.  States callus is formed.  Denying discharge, bleeding, fever.    Past Medical History:  Diagnosis Date  . Costochondritis   . Diabetes mellitus without complication ([place])    NO MEDS  . DVT (deep venous thrombosis) ([place])    LAST DVT IN 2018    Patient Active Problem List   Diagnosis Date Noted  . Shoulder stiffness 03/04/2018  . Edema of lower extremity 03/04/2018  . Shoulder pain 03/04/2018  . Type 2 diabetes mellitus ([place]) 07/03/2015  . Alcohol abuse 06/13/2015  . Colon cancer screening 03/03/2014  . Encounter for therapeutic drug monitoring 10/28/2013  . DVT (deep venous thrombosis) ([place]) 10/07/2013  . DVT of lower limb, acute ([place]) 09/22/2013  . Tobacco use 09/22/2013    Past Surgical History:  Procedure Laterality Date  . BACK SURGERY     LOWER  . CARDIAC CATHETERIZATION N/A 07/19/2016   Procedure: Intravascular Ultrasound/IVUS;  Surgeon: Elam Dutch, MD;  Location: [place] [facility];  Service: Cardiovascular;  Laterality: N/A;  . COLONOSCOPY    . FOOT SURGERY     X2  . INNER EAR SURGERY    . KNEE SURGERY    . NASAL SEPTUM SURGERY    . PERIPHERAL VASCULAR CATHETERIZATION Left 07/19/2016   Procedure: Lower Extremity Venography;  Surgeon: Elam Dutch, MD;  Location: [place] [facility];  Service: Cardiovascular;  Laterality: Left;  . PERIPHERAL VASCULAR CATHETERIZATION  07/19/2016   Procedure: Thrombectomy;  Surgeon: Elam Dutch, MD;  Location: [place] [facility];  Service: Cardiovascular;;  left venous system   . SHOULDER  ARTHROSCOPY WITH OPEN ROTATOR CUFF REPAIR Left 02/10/2018   Procedure: SHOULDER ARTHROSCOPY WITH MINI OPEN ROTATOR CUFF REPAIR,DISTAL CLAVICAL EXCISION, SUBACROMINAL DECOMPRESSION, AND DEBRIDEMENT;  Surgeon: Thornton Park, MD;  Location: [facility];  Service: Orthopedics;  Laterality: Left;  . TONSILECTOMY, ADENOIDECTOMY, BILATERAL MYRINGOTOMY AND TUBES         Home Medications    Prior to Admission medications   Medication Sig Start Date End Date Taking? Authorizing Provider  EPINEPHrine 0.3 mg/0.3 mL IJ SOAJ injection Inject 0.3 mLs (0.3 mg total) into the muscle as needed for anaphylaxis. 04/16/19   Gregor Hams, MD  XARELTO 20 MG TABS tablet TAKE 1 TABLET BY MOUTH EVERY DAY 09/28/19   Waynetta Sandy, MD    Family History Family History  Problem Relation Age of Onset  . COPD Mother   . Emphysema Mother     Social History Social History   Tobacco Use  . Smoking status: Former Smoker    Packs/day: 1.00    Years: 15.00    Pack years: 15.00    Types: Cigarettes    Quit date: 11/09/2018    Years since quitting: 1.0  . Smokeless tobacco: Never Used  Substance Use Topics  . Alcohol use: Not Currently    Alcohol/week: 8.0 standard drinks    Types: 8 Cans of beer per week    Comment: 6-8 DAILY  .  Drug use: No     Allergies   Latex and Thimerosal   Review of Systems As per HPI   Physical Exam Triage Vital Signs ED Triage Vitals [12/03/19 1755]  Enc Vitals Group     BP 127/74     Pulse Rate 84     Resp 18     Temp 98.6 F (37 C)     Temp Source Oral     SpO2 96 %     Weight      Height      Head Circumference      Peak Flow      Pain Score      Pain Loc      Pain Edu?      Excl. in GC?    No data found.  Updated Vital Signs BP 127/74 (BP Location: Left Arm)   Pulse 84   Temp 98.6 F (37 C) (Oral)   Resp 18   SpO2 96%   Visual Acuity Right Eye Distance:   Left Eye Distance:   Bilateral Distance:    Right Eye Near:   Left Eye  Near:    Bilateral Near:     Physical Exam Constitutional:      General: He is not in acute distress. HENT:     Head: Normocephalic and atraumatic.  Eyes:     General: No scleral icterus.    Pupils: Pupils are equal, round, and reactive to light.  Cardiovascular:     Rate and Rhythm: Normal rate.  Pulmonary:     Effort: Pulmonary effort is normal. No respiratory distress.     Breath sounds: No wheezing.  Musculoskeletal:        General: Normal range of motion.     Right lower leg: No edema.     Left lower leg: No edema.  Skin:    Coloration: Skin is not jaundiced or pale.     Comments: Callus noted to middle of ball of foot.  No open wound, active discharge.  No obvious foreign body.  Neurological:     Mental Status: He is alert and oriented to person, place, and time.      UC Treatments / Results  Labs (all labs ordered are listed, but only abnormal results are displayed) Labs Reviewed - No data to display  EKG   Radiology DG Foot Complete Right  Result Date: 12/03/2019 CLINICAL DATA:  Stepped on CT showed L2 weeks ago with possible retained foreign body, initial encounter EXAM: RIGHT FOOT COMPLETE - 3+ VIEW COMPARISON:  None. FINDINGS: No acute fracture or dislocation is noted. Degenerative changes at the first MTP joint as well as in the tarsal bones are seen. Calcaneal spurring is noted. Mild soft tissue prominence is noted in the area of clinical concern along the plantar aspect of the foot although no radiopaque foreign body is noted. IMPRESSION: Soft tissue prominence related to the recent injury. No definitive radiopaque foreign body is seen. Electronically Signed   By: Alcide Clever M.D.   On: 12/03/2019 18:16    Procedures Procedures (including critical care time)  Medications Ordered in UC Medications - No data to display  Initial Impression / Assessment and Plan / UC Course  I have reviewed the triage vital signs and the nursing notes.  Pertinent labs &  imaging results that were available during my care of the patient were reviewed by me and considered in my medical decision making (see chart for details).  Right foot x-ray done office, reviewed by me and radiology: Negative for foreign body.  Reviewed friends and patient verbalized understanding.  Will treat supportively with callus cushions, letting area dry out, and following up with podiatry in 1 week if symptoms are persisting.  Return precautions discussed, patient verbalized understanding and is agreeable to plan. Final Clinical Impressions(s) / UC Diagnoses   Final diagnoses:  Callus of foot     Discharge Instructions     Keep feet dry, wear callus cushions and follow up with podiatry next week.    ED Prescriptions    None     PDMP not reviewed this encounter.   [place], [place], [place] 12/04/19 [phone]

## 2019-12-03 NOTE — ED Triage Notes (Signed)
Pt states stepped on a seashell 2wks ago and thought he got it out of the bottom of rt foot but now has a protruding area where it was. Pt c/o soreness to bare weight.

## 2019-12-03 NOTE — Discharge Instructions (Signed)
Keep feet dry, wear callus cushions and follow up with podiatry next week.

## 2019-12-04 ENCOUNTER — Encounter: Payer: Self-pay | Admitting: Emergency Medicine

## 2020-05-02 ENCOUNTER — Other Ambulatory Visit: Payer: Self-pay | Admitting: *Deleted

## 2020-05-02 DIAGNOSIS — I824Z9 Acute embolism and thrombosis of unspecified deep veins of unspecified distal lower extremity: Secondary | ICD-10-CM

## 2020-05-18 ENCOUNTER — Encounter: Payer: Self-pay | Admitting: Vascular Surgery

## 2020-05-18 ENCOUNTER — Ambulatory Visit (INDEPENDENT_AMBULATORY_CARE_PROVIDER_SITE_OTHER): Payer: BC Managed Care – PPO | Admitting: Vascular Surgery

## 2020-05-18 ENCOUNTER — Other Ambulatory Visit: Payer: Self-pay

## 2020-05-18 ENCOUNTER — Ambulatory Visit (HOSPITAL_COMMUNITY)
Admission: RE | Admit: 2020-05-18 | Discharge: 2020-05-18 | Disposition: A | Discharge status: Home / Self Care | Payer: BC Managed Care – PPO | Source: Ambulatory Visit | Attending: Vascular Surgery | Admitting: Vascular Surgery

## 2020-05-18 VITALS — BP 148/81 | HR 86 | Temp 98.6°F | Resp 20 | Ht 72.0 in | Wt 252.0 lb

## 2020-05-18 DIAGNOSIS — I824Z9 Acute embolism and thrombosis of unspecified deep veins of unspecified distal lower extremity: Secondary | ICD-10-CM | POA: Diagnosis not present

## 2020-05-18 DIAGNOSIS — I87009 Postthrombotic syndrome without complications of unspecified extremity: Secondary | ICD-10-CM

## 2020-05-18 NOTE — Progress Notes (Signed)
Patient name: Scott Quinn MRN: [id number] DOB: 09/12/1960 Sex: male  REASON FOR CONSULT:   HPI: Scott Quinn is a 59 y.o. male, with known prior history of left leg DVT. He noticed recently he has had inflammation warmth and swelling in his left posterior calf. He does not have any shortness of breath or chest pain. 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 Patient had mechanical thrombectomy of his left superficial femoral and popliteal vein in 2018. He has had chronic left leg swelling since that DVT event. He wears compression stockings religiously every day all day long. He does not have any history of known hypercoagulable state or family history of DVTs.  Past Medical History:  Diagnosis Date  . Costochondritis   . Diabetes mellitus without complication ([facility])    NO MEDS  . DVT (deep venous thrombosis) ([facility])    LAST DVT IN 2018   Past Surgical History:  Procedure Laterality Date  . BACK SURGERY     LOWER  . CARDIAC CATHETERIZATION N/A 07/19/2016   Procedure: Intravascular Ultrasound/IVUS;  Surgeon: Sherren Kerns, MD;  Location: [facility] INVASIVE [facility] LAB;  Service: Cardiovascular;  Laterality: N/A;  . COLONOSCOPY    . FOOT SURGERY     X2  . INNER EAR SURGERY    . KNEE SURGERY    . NASAL SEPTUM SURGERY    . PERIPHERAL VASCULAR CATHETERIZATION Left 07/19/2016   Procedure: Lower Extremity Venography;  Surgeon: Sherren Kerns, MD;  Location: [facility] LAB;  Service: Cardiovascular;  Laterality: Left;  . PERIPHERAL VASCULAR CATHETERIZATION  07/19/2016   Procedure: Thrombectomy;  Surgeon: Sherren Kerns, MD;  Location: [facility] INVASIVE CV LAB;  Service: Cardiovascular;;  left venous system   . SHOULDER ARTHROSCOPY WITH OPEN ROTATOR CUFF REPAIR Left 02/10/2018   Procedure: SHOULDER ARTHROSCOPY WITH MINI OPEN ROTATOR CUFF REPAIR,DISTAL CLAVICAL EXCISION, SUBACROMINAL DECOMPRESSION, AND DEBRIDEMENT;  Surgeon: Juanell Fairly, MD;  Location: [facility];  Service:  Orthopedics;  Laterality: Left;  . TONSILECTOMY, ADENOIDECTOMY, BILATERAL MYRINGOTOMY AND TUBES      Family History  Problem Relation Age of Onset  . COPD Mother   . Emphysema Mother     SOCIAL HISTORY: Social History   Socioeconomic History  . Marital status: Single    Spouse name: Not on file  . Number of children: Not on file  . Years of education: Not on file  . Highest education level: Not on file  Occupational History  . Not on file  Tobacco Use  . Smoking status: Former Smoker    Packs/day: 1.00    Years: 15.00    Pack years: 15.00    Types: Cigarettes    Quit date: 11/09/2018    Years since quitting: 1.5  . Smokeless tobacco: Never Used  Vaping Use  . Vaping Use: Never used  Substance and Sexual Activity  . Alcohol use: Not Currently    Alcohol/week: 8.0 standard drinks    Types: 8 Cans of beer per week    Comment: 6-8 DAILY  . Drug use: No  . Sexual activity: Yes  Other Topics Concern  . Not on file  Social History Narrative  . Not on file   Social Determinants of Health   Financial Resource Strain:   . Difficulty of Paying Living Expenses: Not on file  Food Insecurity:   . Worried About Programme researcher, broadcasting/film/video in the Last Year: Not on file  . Ran Out  of Food in the Last Year: Not on file  Transportation Needs:   . Lack of Transportation (Medical): Not on file  . Lack of Transportation (Non-Medical): Not on file  Physical Activity:   . Days of Exercise per Week: Not on file  . Minutes of Exercise per Session: Not on file  Stress:   . Feeling of Stress : Not on file  Social Connections:   . Frequency of Communication with Friends and Family: Not on file  . Frequency of Social Gatherings with Friends and Family: Not on file  . Attends Religious Services: Not on file  . Active Member of Clubs or Organizations: Not on file  . Attends Banker Meetings: Not on file  . Marital Status: Not on file  Intimate Partner Violence:   . Fear of  Current or Ex-Partner: Not on file  . Emotionally Abused: Not on file  . Physically Abused: Not on file  . Sexually Abused: Not on file    Allergies  Allergen Reactions  . Cheese Anaphylaxis  . Latex Shortness Of Breath and Swelling    HAD REACTION TO BAND IN UNDERWEAR AND TO EKG ELECTRODES  . Other Anaphylaxis    Tree nuts, and milk products  . Peanut-Containing Drug Products Anaphylaxis  . Thimerosal Other (See Comments)    Redness     Current Outpatient Medications  Medication Sig Dispense Refill  . EPINEPHrine 0.3 mg/0.3 mL IJ SOAJ injection Inject 0.3 mLs (0.3 mg total) into the muscle as needed for anaphylaxis. 2 each 0  . XARELTO 20 MG TABS tablet TAKE 1 TABLET BY MOUTH EVERY DAY 90 tablet 2   No current facility-administered medications for this visit.    ROS:   General:  No weight loss, Fever, chills  HEENT: No recent headaches, no nasal bleeding, no visual changes, no sore throat  Neurologic: No dizziness, blackouts, seizures. No recent symptoms of stroke or mini- stroke. No recent episodes of slurred speech, or temporary blindness.  Cardiac: No recent episodes of chest pain/pressure, no shortness of breath at rest.  No shortness of breath with exertion.  Denies history of atrial fibrillation or irregular heartbeat  Vascular: No history of rest pain in feet.  No history of claudication.  No history of non-healing ulcer, + history of DVT   Pulmonary: No home oxygen, no productive cough, no hemoptysis,  No asthma or wheezing  Musculoskeletal:  [ ]  Arthritis, [ ]  Low back pain,  [ ]  Joint pain  Hematologic:No history of hypercoagulable state.  No history of easy bleeding.  No history of anemia  Gastrointestinal: No hematochezia or melena,  No gastroesophageal reflux, no trouble swallowing  Urinary: [ ]  chronic Kidney disease, [ ]  on HD - [ ]  MWF or [ ]  TTHS, [ ]  Burning with urination, [ ]  Frequent urination, [ ]  Difficulty urinating;   Skin: No  rashes  Psychological: No history of anxiety,  No history of depression   Physical Examination  Vitals:   05/18/20 1041  BP: (!) 148/81  Pulse: 86  Resp: 20  Temp: 98.6 F (37 C)  SpO2: 95%  Weight: 252 lb (114.3 kg)  Height: 6' (1.829 m)    General:  Alert and oriented, no acute distress HEENT: Normal Neck: No JVD Cardiac: Regular Rate and Rhythm Skin: No rash, left posterior calf warm slightly erythematous Extremity Pulses:  2+ radial, brachial, femoral, dorsalis pedis, posterior tibial pulses bilaterally Musculoskeletal: No deformity chronic left leg edema approximately 20% larger  than the right leg extending from the knee down to the foot with prominent varicosities in the left leg  Neurologic: Upper and lower extremity motor 5/5 and symmetric  DATA:  Patient had a venous duplex today. This showed chronic findings of DVT in the left popliteal vein. This showed about 50% narrowing. I reviewed and interpreted the study.  Reflux exam in 2018 did show reflux in the left greater saphenous vein with only 3 to 4 mm diameter.  ASSESSMENT: Left leg potential thrombophlebitis with inflammation. No evidence of DVT. Chronic DVT left popliteal vein. Patient is currently on Xarelto.   PLAN: Patient will do 1 week of scheduled Tylenol to decrease inflammation in the left calf. He will alternate ice and warm compresses. He will elevate the leg is much as possible.  Patient will follow up with me in 4 to 6 weeks with a left lower extremity reflux exam as well as have further conversations at that point about discontinuing his Xarelto. He may also need a hypercoagulable profile prior to considering stopping this. He has only had one prior DVT event so I do not necessarily want to commit him to lifelong anticoagulation at this point.   Fabienne Bruns, MD [facility] Office: [phone]

## 2020-05-19 ENCOUNTER — Other Ambulatory Visit: Payer: Self-pay

## 2020-05-19 DIAGNOSIS — I824Z9 Acute embolism and thrombosis of unspecified deep veins of unspecified distal lower extremity: Secondary | ICD-10-CM

## 2020-06-22 ENCOUNTER — Encounter: Payer: Self-pay | Admitting: Vascular Surgery

## 2020-06-22 ENCOUNTER — Ambulatory Visit (INDEPENDENT_AMBULATORY_CARE_PROVIDER_SITE_OTHER): Payer: BC Managed Care – PPO | Admitting: Vascular Surgery

## 2020-06-22 ENCOUNTER — Other Ambulatory Visit: Payer: Self-pay

## 2020-06-22 ENCOUNTER — Ambulatory Visit (HOSPITAL_COMMUNITY)
Admission: RE | Admit: 2020-06-22 | Discharge: 2020-06-22 | Disposition: A | Discharge status: Home / Self Care | Payer: BC Managed Care – PPO | Source: Ambulatory Visit | Attending: Vascular Surgery | Admitting: Vascular Surgery

## 2020-06-22 VITALS — BP 152/89 | HR 92 | Temp 98.7°F | Resp 20 | Ht 72.0 in | Wt 252.0 lb

## 2020-06-22 DIAGNOSIS — I824Z9 Acute embolism and thrombosis of unspecified deep veins of unspecified distal lower extremity: Secondary | ICD-10-CM | POA: Insufficient documentation

## 2020-06-22 DIAGNOSIS — I87009 Postthrombotic syndrome without complications of unspecified extremity: Secondary | ICD-10-CM

## 2020-06-22 NOTE — Progress Notes (Signed)
Patient is a 59 year old male who returns for follow-up today.  He was last seen on May 18, 2020.  At that point he was experiencing pain warmth inflammation behind his left posterior calf.  He has a prior history of left leg DVT.  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  He was given a prescription for around-the-clock Tylenol leg elevation and compresses.  His symptoms have essentially resolved at this point.  He did have a prior left leg DVT in 2018 and has had a chronically swollen leg since then.  He is currently on Xarelto and would like to stop this if possible.  He has no history of hypercoagulable state.  He is fairly sedentary sometimes in his job but this was his only risk factor prior to have experiencing his first DVT.  Review of systems: He has no shortness of breath.  He has no chest pain.  Current Outpatient Medications on File Prior to Visit  Medication Sig Dispense Refill  . EPINEPHrine 0.3 mg/0.3 mL IJ SOAJ injection Inject 0.3 mLs (0.3 mg total) into the muscle as needed for anaphylaxis. 2 each 0  . XARELTO 20 MG TABS tablet TAKE 1 TABLET BY MOUTH EVERY DAY 90 tablet 2   No current facility-administered medications on file prior to visit.    Past Medical History:  Diagnosis Date  . Costochondritis   . Diabetes mellitus without complication ([facility])    NO MEDS  . DVT (deep venous thrombosis) ([facility])    LAST DVT IN 2018    Past Surgical History:  Procedure Laterality Date  . BACK SURGERY     LOWER  . CARDIAC CATHETERIZATION N/A 07/19/2016   Procedure: Intravascular Ultrasound/IVUS;  Surgeon: Sherren Kerns, MD;  Location: [facility] INVASIVE CV LAB;  Service: Cardiovascular;  Laterality: N/A;  . COLONOSCOPY    . FOOT SURGERY     X2  . INNER EAR SURGERY    . KNEE SURGERY    . NASAL SEPTUM SURGERY    . PERIPHERAL VASCULAR CATHETERIZATION Left 07/19/2016   Procedure: Lower Extremity Venography;  Surgeon: Sherren Kerns, MD;  Location: [facility]  INVASIVE CV LAB;  Service: Cardiovascular;  Laterality: Left;  . PERIPHERAL VASCULAR CATHETERIZATION  07/19/2016   Procedure: Thrombectomy;  Surgeon: Sherren Kerns, MD;  Location: [facility] INVASIVE CV LAB;  Service: Cardiovascular;;  left venous system   . SHOULDER ARTHROSCOPY WITH OPEN ROTATOR CUFF REPAIR Left 02/10/2018   Procedure: SHOULDER ARTHROSCOPY WITH MINI OPEN ROTATOR CUFF REPAIR,DISTAL CLAVICAL EXCISION, SUBACROMINAL DECOMPRESSION, AND DEBRIDEMENT;  Surgeon: Juanell Fairly, MD;  Location: [facility];  Service: Orthopedics;  Laterality: Left;  . TONSILECTOMY, ADENOIDECTOMY, BILATERAL MYRINGOTOMY AND TUBES      Physical exam:  Vitals:   06/22/20 1452  BP: (!) 152/89  Pulse: 92  Resp: 20  Temp: 98.7 F (37.1 C)  SpO2: 95%  Weight: 252 lb (114.3 kg)  Height: 6' (1.829 m)    Left lower extremity approximately 5 to 10% larger than the right lower extremity mild tenderness left popliteal fossa area no significant erythema or warmth at this point  Data: Patient had a duplex ultrasound for venous reflux today.  This showed diffuse reflux in the greater saphenous vein left leg but vein diameter was less than 4 mm over most of the vein.  He had diffuse reflux throughout the left common femoral and superficial femoral vein.  There was still some residual thrombus in the popliteal vein but nonocclusive.  Assessment: Improved symptoms  with recent thrombophlebitis flareup.  Patient currently on anticoagulation for DVT in 2018.  He would like to get off anticoagulation.  I believe overall his risk of recurrent DVT is fairly low.  Plan: Patient will stop his anticoagulation today.  We will do a hypercoagulable testing profile on him in 2 weeks.  We will then do a virtual visit to follow-up on those results.  If his results are negative we will probably stop his anticoagulation at that point.  He understands that there is some low risk of recurrent DVT with prior DVT history and his sedentary job but  overall this would be fairly low.  Fabienne Bruns, MD [facility] [place] Office: [phone]

## 2020-07-06 ENCOUNTER — Encounter: Payer: Self-pay | Admitting: Vascular Surgery

## 2020-07-13 ENCOUNTER — Ambulatory Visit: Payer: BC Managed Care – PPO | Admitting: Vascular Surgery

## 2020-07-15 HISTORY — PX: FOOT SURGERY: SHX648

## 2020-08-03 ENCOUNTER — Ambulatory Visit: Payer: BC Managed Care – PPO | Admitting: Vascular Surgery

## 2020-08-24 ENCOUNTER — Ambulatory Visit (INDEPENDENT_AMBULATORY_CARE_PROVIDER_SITE_OTHER): Payer: BC Managed Care – PPO | Admitting: Vascular Surgery

## 2020-08-24 ENCOUNTER — Other Ambulatory Visit: Payer: Self-pay

## 2020-08-24 DIAGNOSIS — I824Z9 Acute embolism and thrombosis of unspecified deep veins of unspecified distal lower extremity: Secondary | ICD-10-CM | POA: Diagnosis not present

## 2020-08-24 NOTE — Progress Notes (Signed)
Virtual Visit via Telephone Note  I connected with Scott Quinn on 08/24/2020 using the telephone and verified that I was speaking with the correct person using two identifiers. Patient was located at home and accompanied by no one. I am located at our office on [facility].   The limitations of evaluation and management by telemedicine and the availability of in person appointments have been previously discussed with the patient and are documented in the patients chart. The patient expressed understanding and consented to proceed.  PCP: Patient, No Pcp Per  History of Present Illness: Scott Quinn is a 60 y.o. male with history of left leg DVT.  He had a popliteal DVT in 2018.  He returns today for discussions regarding his recent hypercoagulable profile results.  Patient has not had any changes in his leg symptoms.  Past Medical History:  Diagnosis Date  . Costochondritis   . Diabetes mellitus without complication ([facility])    NO MEDS  . DVT (deep venous thrombosis) ([facility])    LAST DVT IN 2018    Past Surgical History:  Procedure Laterality Date  . BACK SURGERY     LOWER  . CARDIAC CATHETERIZATION N/A 07/19/2016   Procedure: Intravascular Ultrasound/IVUS;  Surgeon: Sherren Kerns, MD;  Location: [facility] INVASIVE CV LAB;  Service: Cardiovascular;  Laterality: N/A;  . COLONOSCOPY    . FOOT SURGERY     X2  . INNER EAR SURGERY    . KNEE SURGERY    . NASAL SEPTUM SURGERY    . PERIPHERAL VASCULAR CATHETERIZATION Left 07/19/2016   Procedure: Lower Extremity Venography;  Surgeon: Sherren Kerns, MD;  Location: [facility] INVASIVE CV LAB;  Service: Cardiovascular;  Laterality: Left;  . PERIPHERAL VASCULAR CATHETERIZATION  07/19/2016   Procedure: Thrombectomy;  Surgeon: Sherren Kerns, MD;  Location: [facility] INVASIVE CV LAB;  Service: Cardiovascular;;  left venous system   . SHOULDER ARTHROSCOPY WITH OPEN ROTATOR CUFF REPAIR Left 02/10/2018   Procedure: SHOULDER ARTHROSCOPY WITH MINI OPEN ROTATOR  CUFF REPAIR,DISTAL CLAVICAL EXCISION, SUBACROMINAL DECOMPRESSION, AND DEBRIDEMENT;  Surgeon: Juanell Fairly, MD;  Location: [facility] ORS;  Service: Orthopedics;  Laterality: Left;  . TONSILECTOMY, ADENOIDECTOMY, BILATERAL MYRINGOTOMY AND TUBES      No outpatient medications have been marked as taking for the 08/24/20 encounter (Appointment) with Sherren Kerns, MD.      Review of systems: He has no shortness of breath or chest pain.    Observations/Objective: I reviewed the patient's hypercoagulable profile dated July 06, 2020.  This showed some elevation of his homocysteine levels.  He had a very slight decrease in Antithrombin activity and had heterozygous prothrombin mutation.  These were all very subtle abnormalities in his clotting profile and I do not believe that these warrant committing him to a lifetime of anticoagulation unless he had a new event.  All of this was discussed with the patient.  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  Overall fairly low risk for recurrent DVT.   Follow Up Instructions:   The patient will follow up on an as-needed basis.  He inquired about whether or not he could use a pumping device on his legs with prior history of DVT.  I did review his prior ultrasound from 2021 November which showed some residual chronic thrombus in the left popliteal vein.  This should not be at risk of  distal embolization since his DVT was almost 4 years ago.  I did encourage him to continue to wear compression stockings and to be ambulatory as much as possible especially if he takes any long car rides or flights.  Otherwise he will follow up on an as-needed basis.  If he developed a new DVT at some point in the future most likely he would need a life time commitment to anticoagulation..  I spent  with the patient via telephone encounter.   Signed, Fabienne Bruns [facility] [place] Office: [phone]  08/24/2020, 3:33 PM

## 2021-03-11 ENCOUNTER — Other Ambulatory Visit: Payer: Self-pay | Admitting: Orthopedic Surgery

## 2021-03-21 ENCOUNTER — Encounter: Payer: Self-pay | Admitting: Orthopedic Surgery

## 2021-03-28 MED ORDER — KETOROLAC TROMETHAMINE 30 MG/ML IJ SOLN
INTRAMUSCULAR | Status: AC
  Filled 2021-03-28: qty 1

## 2021-03-29 ENCOUNTER — Ambulatory Visit: Payer: BC Managed Care – PPO | Admitting: Anesthesiology

## 2021-03-29 ENCOUNTER — Encounter: Payer: Self-pay | Admitting: Orthopedic Surgery

## 2021-03-29 ENCOUNTER — Ambulatory Visit
Admission: RE | Admit: 2021-03-29 | Discharge: 2021-03-29 | Disposition: A | Discharge status: Home / Self Care | Payer: BC Managed Care – PPO | Attending: Orthopedic Surgery | Admitting: Orthopedic Surgery

## 2021-03-29 ENCOUNTER — Encounter
Admission: RE | Disposition: A | Discharge status: Home / Self Care | Payer: Self-pay | Source: Home / Self Care | Attending: Orthopedic Surgery

## 2021-03-29 ENCOUNTER — Other Ambulatory Visit: Payer: Self-pay

## 2021-03-29 DIAGNOSIS — W2203XA Walked into furniture, initial encounter: Secondary | ICD-10-CM | POA: Insufficient documentation

## 2021-03-29 DIAGNOSIS — M19011 Primary osteoarthritis, right shoulder: Secondary | ICD-10-CM | POA: Insufficient documentation

## 2021-03-29 DIAGNOSIS — M75121 Complete rotator cuff tear or rupture of right shoulder, not specified as traumatic: Secondary | ICD-10-CM | POA: Insufficient documentation

## 2021-03-29 DIAGNOSIS — Z86718 Personal history of other venous thrombosis and embolism: Secondary | ICD-10-CM | POA: Diagnosis not present

## 2021-03-29 DIAGNOSIS — Z87891 Personal history of nicotine dependence: Secondary | ICD-10-CM | POA: Diagnosis not present

## 2021-03-29 HISTORY — PX: SHOULDER ARTHROSCOPY WITH OPEN ROTATOR CUFF REPAIR AND DISTAL CLAVICLE ACROMINECTOMY: SHX5683

## 2021-03-29 HISTORY — DX: Presence of spectacles and contact lenses: Z97.3

## 2021-03-29 SURGERY — SHOULDER ARTHROSCOPY WITH OPEN ROTATOR CUFF REPAIR AND DISTAL CLAVICLE ACROMINECTOMY
Anesthesia: General | Site: Shoulder | Laterality: Right

## 2021-03-29 MED ORDER — LIDOCAINE HCL (CARDIAC) PF 100 MG/5ML IV SOSY
PREFILLED_SYRINGE | INTRAVENOUS | Status: DC | PRN
  Administered 2021-03-29: 40 mg via INTRATRACHEAL

## 2021-03-29 MED ORDER — ACETAMINOPHEN 500 MG PO TABS
1000.0000 mg | ORAL_TABLET | ORAL | Status: AC
  Administered 2021-03-29: 1000 mg via ORAL

## 2021-03-29 MED ORDER — LACTATED RINGERS IR SOLN
Status: DC | PRN
  Administered 2021-03-29: 21000 mL

## 2021-03-29 MED ORDER — OXYCODONE HCL 5 MG/5ML PO SOLN: 5.0000 mg | Freq: Once | ORAL | Status: DC | PRN

## 2021-03-29 MED ORDER — EPINEPHRINE PF 1 MG/ML IJ SOLN
INTRAMUSCULAR | Status: DC | PRN
  Administered 2021-03-29: 4 mL

## 2021-03-29 MED ORDER — MIDAZOLAM HCL 5 MG/5ML IJ SOLN
INTRAMUSCULAR | Status: DC | PRN
  Administered 2021-03-29: 2 mg via INTRAVENOUS

## 2021-03-29 MED ORDER — ONDANSETRON HCL 4 MG/2ML IJ SOLN: 4.0000 mg | Freq: Once | INTRAMUSCULAR | Status: DC | PRN

## 2021-03-29 MED ORDER — FENTANYL CITRATE PF 50 MCG/ML IJ SOSY: 25.0000 ug | PREFILLED_SYRINGE | INTRAMUSCULAR | Status: DC | PRN

## 2021-03-29 MED ORDER — ONDANSETRON HCL 4 MG/2ML IJ SOLN
INTRAMUSCULAR | Status: DC | PRN
  Administered 2021-03-29: 4 mg via INTRAVENOUS

## 2021-03-29 MED ORDER — FENTANYL CITRATE (PF) 100 MCG/2ML IJ SOLN
INTRAMUSCULAR | Status: DC | PRN
  Administered 2021-03-29: 100 ug via INTRAVENOUS

## 2021-03-29 MED ORDER — LACTATED RINGERS IV SOLN: INTRAVENOUS | Status: DC

## 2021-03-29 MED ORDER — CHLORHEXIDINE GLUCONATE CLOTH 2 % EX PADS: 6.0000 | MEDICATED_PAD | Freq: Once | CUTANEOUS | Status: DC

## 2021-03-29 MED ORDER — OXYCODONE HCL 5 MG PO TABS: 5.0000 mg | ORAL_TABLET | ORAL | 0 refills | Status: AC | PRN

## 2021-03-29 MED ORDER — CEFAZOLIN SODIUM-DEXTROSE 2-4 GM/100ML-% IV SOLN
2.0000 g | INTRAVENOUS | Status: AC
  Administered 2021-03-29: 2 g via INTRAVENOUS

## 2021-03-29 MED ORDER — BUPIVACAINE LIPOSOME 1.3 % IJ SUSP
INTRAMUSCULAR | Status: DC | PRN
  Administered 2021-03-29: 20 mL via PERINEURAL

## 2021-03-29 MED ORDER — OXYCODONE HCL 5 MG PO TABS: 5.0000 mg | ORAL_TABLET | Freq: Once | ORAL | Status: DC | PRN

## 2021-03-29 MED ORDER — BUPIVACAINE HCL (PF) 0.5 % IJ SOLN
INTRAMUSCULAR | Status: DC | PRN
  Administered 2021-03-29: 20 mL via PERINEURAL

## 2021-03-29 MED ORDER — ONDANSETRON HCL 4 MG PO TABS: 4.0000 mg | ORAL_TABLET | Freq: Three times a day (TID) | ORAL | 0 refills | Status: AC | PRN

## 2021-03-29 MED ORDER — PROPOFOL 10 MG/ML IV BOLUS
INTRAVENOUS | Status: DC | PRN
Start: 2021-03-29 — End: 2021-03-29
  Administered 2021-03-29: 150 mg via INTRAVENOUS

## 2021-03-29 MED ORDER — PHENYLEPHRINE HCL (PRESSORS) 10 MG/ML IV SOLN
INTRAVENOUS | Status: DC | PRN
  Administered 2021-03-29 (×2): 100 ug via INTRAVENOUS

## 2021-03-29 MED ORDER — GLYCOPYRROLATE 0.2 MG/ML IJ SOLN
INTRAMUSCULAR | Status: DC | PRN
  Administered 2021-03-29: .2 mg via INTRAVENOUS

## 2021-03-29 SURGICAL SUPPLY — 73 items
ADAPTER IRRIG TUBE 2 SPIKE SOL (ADAPTER) ×6 IMPLANT
ADPR TBG 2 SPK PMP STRL ASCP (ADAPTER) ×4
ANCH SUT 2 SUTTK 14.5X3 FW (Anchor) ×2 IMPLANT
ANCH SUT 5.5 KNTLS PEEK (Orthopedic Implant) ×4 IMPLANT
ANCH SUT Q-FX 2.8 (Anchor) ×4 IMPLANT
ANCHOR ALL-SUT Q-FIX 2.8 (Anchor) ×8 IMPLANT
ANCHOR SUT  BIOC ST 3X14.5 (Anchor) ×3 IMPLANT
ANCHOR SUT 5.5 MULTIFIX (Orthopedic Implant) ×4 IMPLANT
ANCHOR SUT BIOC ST 3X14.5 (Anchor) ×1 IMPLANT
BLADE SHAVER 4.5X7 STR FR (MISCELLANEOUS) ×1 IMPLANT
BLADE SHAVER AGGRES 5.5  STR (CUTTER) ×3
BLADE SHAVER AGGRES 5.5 STR (CUTTER) ×1 IMPLANT
BUR BR 5.5 WIDE MOUTH (BURR) ×1 IMPLANT
CANNULA 5.75X7 CRYSTAL CLEAR (CANNULA) ×4 IMPLANT
CANNULA PARTIAL THREAD 2X7 (CANNULA) ×3 IMPLANT
CANNULA TWIST IN 8.25X9CM (CANNULA) ×2 IMPLANT
CONNECTOR PERFECT PASSER (CONNECTOR) ×4 IMPLANT
COOLER POLAR GLACIER W/PUMP (MISCELLANEOUS) ×3 IMPLANT
COVER LIGHT HANDLE FLEXIBLE (MISCELLANEOUS) ×6 IMPLANT
DEVICE SUCT BLK HOLE OR FLOOR (MISCELLANEOUS) IMPLANT
DRAPE IMP U-DRAPE 54X76 (DRAPES) ×6 IMPLANT
DRAPE INCISE IOBAN 66X45 STRL (DRAPES) ×3 IMPLANT
DRAPE SHEET LG 3/4 BI-LAMINATE (DRAPES) ×3 IMPLANT
DRAPE U 60X70 (DRAPES) ×3 IMPLANT
DURAPREP 26ML APPLICATOR (WOUND CARE) ×9 IMPLANT
DYONICS 4.0 FLYER BLADE ×2 IMPLANT
ELECT REM PT RETURN 9FT ADLT (ELECTROSURGICAL) ×3
ELECTRODE REM PT RTRN 9FT ADLT (ELECTROSURGICAL) ×2 IMPLANT
GAUZE SPONGE 4X4 12PLY STRL (GAUZE/BANDAGES/DRESSINGS) ×3 IMPLANT
GAUZE XEROFORM 1X8 LF (GAUZE/BANDAGES/DRESSINGS) ×3 IMPLANT
GLOVE SURG UNDER POLY LF SZ9 (GLOVE) ×11 IMPLANT
GOWN STRL REUS TWL 2XL XL LVL4 (GOWN DISPOSABLE) ×3 IMPLANT
GOWN STRL REUS W/ TWL LRG LVL3 (GOWN DISPOSABLE) ×2 IMPLANT
GOWN STRL REUS W/TWL LRG LVL3 (GOWN DISPOSABLE) ×3
IV LACTATED RINGER IRRG 3000ML (IV SOLUTION) ×21
IV LR IRRIG 3000ML ARTHROMATIC (IV SOLUTION) ×15 IMPLANT
KIT STABILIZATION SHOULDER (MISCELLANEOUS) ×3 IMPLANT
KIT SUTURE 2.8 Q-FIX DISP (MISCELLANEOUS) ×3 IMPLANT
KIT SUTURETAK 3.0 INSERT PERC (KITS) ×2 IMPLANT
KIT TURNOVER KIT A (KITS) ×3 IMPLANT
MANIFOLD NEPTUNE II (INSTRUMENTS) ×3 IMPLANT
MASK FACE SPIDER DISP (MASK) ×3 IMPLANT
MAT ABSORB  FLUID 56X50 GRAY (MISCELLANEOUS) ×9
MAT ABSORB FLUID 56X50 GRAY (MISCELLANEOUS) ×5 IMPLANT
NDL SAFETY ECLIPSE 18X1.5 (NEEDLE) ×2 IMPLANT
NEEDLE HYPO 18GX1.5 SHARP (NEEDLE) ×3
NEEDLE HYPO 22GX1.5 SAFETY (NEEDLE) ×1 IMPLANT
NS IRRIG 500ML POUR BTL (IV SOLUTION) ×1 IMPLANT
PACK ARTHROSCOPY SHOULDER (MISCELLANEOUS) ×3 IMPLANT
PAD WRAPON POLAR SHDR XLG (MISCELLANEOUS) ×2 IMPLANT
PASSER SUT FIRSTPASS SELF (INSTRUMENTS) ×3 IMPLANT
SPONGE T-LAP 18X18 ~~LOC~~+RFID (SPONGE) ×1 IMPLANT
STRIP CLOSURE SKIN 1/2X4 (GAUZE/BANDAGES/DRESSINGS) ×3 IMPLANT
SUT ETHILON 4-0 (SUTURE) ×6
SUT ETHILON 4-0 FS2 18XMFL BLK (SUTURE) ×4
SUT LASSO 90 DEG CVD (SUTURE) ×2 IMPLANT
SUT LASSO 90 DEG SD STR (SUTURE) IMPLANT
SUT MNCRL 4-0 (SUTURE) ×3
SUT MNCRL 4-0 27XMFL (SUTURE) ×2
SUT PERFECTPASSER WHITE CART (SUTURE) ×4 IMPLANT
SUT SMART STITCH CARTRIDGE (SUTURE) ×10 IMPLANT
SUT ULTRABRAID 2 COBRAID 38 (SUTURE) IMPLANT
SUT VIC AB 0 CT1 36 (SUTURE) ×5 IMPLANT
SUT VIC AB 2-0 CT2 27 (SUTURE) ×3 IMPLANT
SUTURE ETHLN 4-0 FS2 18XMF BLK (SUTURE) ×3 IMPLANT
SUTURE MNCRL 4-0 27XMF (SUTURE) ×2 IMPLANT
SYR 10ML LL (SYRINGE) ×3 IMPLANT
TAPE MICROFOAM 4IN (TAPE) ×6 IMPLANT
TUBING CONNECTING 10 (TUBING) ×3 IMPLANT
TUBING INFLOW SET DBFLO PUMP (TUBING) ×3 IMPLANT
TUBING OUTFLOW SET DBLFO PUMP (TUBING) ×3 IMPLANT
WAND WEREWOLF FLOW 90D (MISCELLANEOUS) ×3 IMPLANT
WRAPON POLAR PAD SHDR XLG (MISCELLANEOUS) ×3

## 2021-03-29 NOTE — Anesthesia Postprocedure Evaluation (Signed)
Anesthesia Post Note  Patient: Scott Quinn  Procedure(s) Performed: RIGHT SHOULDER ARTHROSCOPY WITH DISTAL CLAVICAL EXCISION, SUBACROMIAL DECOMPRESSION, LABRAL REPAIR AND MINI-OPEN ROTATOR CUFF REPAIR (Right: Shoulder)     Patient location during evaluation: PACU Anesthesia Type: General Level of consciousness: awake and alert and oriented Pain management: satisfactory to patient Vital Signs Assessment: post-procedure vital signs reviewed and stable Respiratory status: spontaneous breathing, nonlabored ventilation and respiratory function stable Cardiovascular status: blood pressure returned to baseline and stable Postop Assessment: Adequate PO intake and No signs of nausea or vomiting Anesthetic complications: no   No notable events documented.  [place]ly [place]

## 2021-03-29 NOTE — Transfer of Care (Signed)
Immediate Anesthesia Transfer of Care Note  Patient: Scott Quinn  Procedure(s) Performed: RIGHT SHOULDER ARTHROSCOPY WITH DISTAL CLAVICAL EXCISION, SUBACROMIAL DECOMPRESSION, LABRAL REPAIR AND MINI-OPEN ROTATOR CUFF REPAIR (Right: Shoulder)  Patient Location: PACU  Anesthesia Type: General LMA  Level of Consciousness: awake, alert  and patient cooperative  Airway and Oxygen Therapy: Patient Spontanous Breathing and Patient connected to supplemental oxygen  Post-op Assessment: Post-op Vital signs reviewed, Patient's Cardiovascular Status Stable, Respiratory Function Stable, Patent Airway and No signs of Nausea or vomiting  Post-op Vital Signs: Reviewed and stable  Complications: No notable events documented.

## 2021-03-29 NOTE — Op Note (Signed)
03/29/2021  5:15 PM  PATIENT:  Scott Quinn  60 y.o. male  PRE-OPERATIVE DIAGNOSIS:  Right Rotator Cuff Tear  POST-OPERATIVE DIAGNOSIS:  Right Rotator Cuff Tear and anterior inferior labral tear, subacromial impingement with bursitis and advanced acromioclavicular joint arthrosis  PROCEDURE:  Procedure(s) with comments: RIGHT SHOULDER ARTHROSCOPIC SUBACROMIAL DECOMPRESSION, ANTERIOR INFERIOR LABRAL REPAIR, DISTAL CLAVICAL EXCISION, AND MINI-OPEN ROTATOR CUFF REPAIR   Anesthesia: General and Interscalene Block (exparel)  SURGEON:  Surgeon(s) and Role:    Thornton Park, MD - Primary  EBL: 5 cc  PREOPERATIVE INDICATIONS:  Scott Quinn is a  60 y.o. male with a diagnosis of Right Rotator Cuff Tear, confirmed by MRI.  I recommended surgical fixation for the patient's tear given the acuity of his injury and his high level of functioning at baseline.  Patient had an MRI showing a full-thickness and retracted tear.  Patient has successfully undergone left shoulder rotator cuff repair surgery by me previously.  Patient elected to proceed with surgical fixation for his right rotator cuff tear.  The risks benefits and alternatives were discussed with the patient preoperatively including but not limited to the risks of infection, bleeding, nerve injury, persistent pain or weakness, shoulder stiffness/arthrofibrosis, failure of the repair, re-tear of the rotator cuff and the need for further surgery. Medical risks include DVT and pulmonary embolism, myocardial infarction, stroke, pneumonia, respiratory failure and death. Patient understood these risks and wished to proceed.  OPERATIVE IMPLANTS:  [place] Multifix anchors x 2 & Smith & Nephew Q Fix anchors x 2  Arthrex bio suturetak anchors  1 for labral fixation  OPERATIVE FINDINGS:  Large full-thickness and retracted tear of the rotator cuff involving the supra and infraspinatus tendons. 2.  Anterior inferior labral tear 3.   Previous spontaneous rupture of the long head of the biceps tendon 4.  Fraying of the subscapularis without full-thickness tear. 5.  Subacromial impingement with bursitis 6.  Advanced acromioclavicular joint arthrosis   OPERATIVE PROCEDURE: The patient was met in the preoperative area. The right shoulder was signed with the word yes and my initials according the hospital's correct site of surgery protocol.   A pre-op history and physical was performed at the bedside.   Patient underwent an interscalene block with Exparel by the anesthesia service.  Answered all questions by the patient and his wife who is with him in the preop area.  Patient was brought to the operating room where he underwent general anesthesia.  The patient was placed in a beachchair position.  A spider arm positioner was used for this case.  Examination under anesthesia revealed no loss of passive range of motion or instability with load shift testing. The patient had a negative sulcus sign.  Patient was prepped and draped in a sterile fashion. A timeout was performed to verify the patient's name, date of birth, medical record number, correct site of surgery and correct procedure to be performed there was also used to verify the patient received antibiotics that all appropriate instruments, implants and radiographs studies were available in the room. Once all in attendance were in agreement case began.  Patient received ancef 2 grams IV for pre-op antibiotics.  Bony landmarks were drawn out with a surgical marker along with proposed arthroscopy incisions.  An 11 blade was used to establish a posterior portal through which the arthroscope was placed in the glenohumeral joint. A full diagnostic examination of the shoulder was performed.  Patient was found to have an anterior -  inferior labral tear extending from approximately the 4:00 to 6 o'clock position.  A 7 mm cannula was placed through an anterolateral portal.  An arthroscopic  elevator was used to mobilize the anterior inferior labrum.  The nonarticular portion of anterior glenoid was debrided with a 4.34m Dyonics flyer shaver blade. A single Arthrex BNIKEanchor was then placed at the 5 o'clock position.   A 90 degree Arthrex suture lasso was then used to shuttle a single limb of this anchor under the anterior inferior labrum.  Arthroscopic knot tying technique was then used to complete the anterior inferior labral repair.  The labrum was then probed and found to be stable.  A second anchor was not required for stability.  Final arthroscopic images of the labral pair were taken.  The arthroscope was then placed in the subacromial space.  Extensive bursitis was encountered and debrided using a 4-0 Dyonics flyer shaver blade and a 90 werewolf wand from a lateral portal which was established under direct visualization using an 18-gauge spinal needle. A subacromial decompression was performed using a 5.5 mm Dyonics bone cutter shaver blade from the lateral portal.   A distal clavicle excision was then performed through the anterior portal also using the 5.5 mm bone cutter shaver blade.  The 5.5 mm bone cutter shaver blade was then used to debride the greater tuberosity of all torn fibers of the rotator cuff.   Three Perfect Pass sutures were placed in the lateral border of the rotator cuff tear. All arthroscopic instruments were then removed and the mini-open portion of the procedure began.  A saber-type incision was made along the lateral border of the acromion. The deltoid muscle was identified and split in line with its fibers which allowed visualization of the rotator cuff. The Perfect Pass sutures previously placed in the lateral border of the rotator cuff were brought out through the deltoid split.  Two  Smith and NBorgWarnerFix anchors were placed at the articular margin of the humeral head with the grea ter tuberosity. The suture limbs of the Q Fix anchors were passed  medially through the rotator cuff using a First Pass suture passer and clamped with a hemostat for later fixation. Three additional perfect pass sutures were placed in the lateral border the rotator cuff for a total of 6 sutures.  The  Perfect Pass sutures were then anchored to the greater tuberosity footprint using two [place]ifix anchors. The sutures passed through the Multifix anchors were then tensioned to allow reduction of the rotator cuff to the greater tuberosity footprint. The medial row repair was then performed using the Q fix sutures, which were tied down using an arthroscopic knot tying technique.  Arthroscopic images of the repair were taken with the arthroscope both externally and from inside the glenohumeral joint.  All incisions were copiously irrigated. The deltoid fascia was repaired using a 0 Vicryl suture.  The subcutaneous tissue of all incisions were closed with a 2-0 Vicryl. Skin closure for the arthroscopic incisions was performed with 4-0 nylon. The skin edges of the saber incision was approximated with a running 4-0 undyed Monocryl.  A dry sterile dressing was applied.  The patient was placed in an abduction sling and a Polar Care was applied to the shoulder.  All sharp, sponge and it instrument counts were correct at the conclusion of the case. I was scrubbed and present for the entire case. I spoke with the patient's wife postoperatively to let her  know the case had been performed without complication and the patient was stable in recovery room.

## 2021-03-29 NOTE — Discharge Instructions (Signed)
Information for Discharge Teaching: EXPAREL (bupivacaine liposome injectable suspension)   Your surgeon or anesthesiologist gave you EXPAREL(bupivacaine) to help control your pain after surgery.  EXPAREL is a local anesthetic that provides pain relief by numbing the tissue around the surgical site. EXPAREL is designed to release pain medication over time and can control pain for up to 72 hours. Depending on how you respond to EXPAREL, you may require less pain medication during your recovery.  Possible side effects: Temporary loss of sensation or ability to move in the area where bupivacaine was injected. Nausea, vomiting, constipation Rarely, numbness and tingling in your mouth or lips, lightheadedness, or anxiety may occur. Call your doctor right away if you think you may be experiencing any of these sensations, or if you have other questions regarding possible side effects.  Follow all other discharge instructions given to you by your surgeon or nurse. Eat a healthy diet and drink plenty of water or other fluids.  If you return to the hospital for any reason within 96 hours following the administration of EXPAREL, it is important for health care providers to know that you have received this anesthetic. A teal colored band has been placed on your arm with the date, time and amount of EXPAREL you have received in order to alert and inform your health care providers. Please leave this armband in place for the full 96 hours following administration, and then you may remove the band.  PERIPHERAL NERVE BLOCK PATIENT INFORMATION  Your surgeon has requested a peripheral nerve block for your surgery. This anesthetic technique provides excellent post-operative pain relief for you in a safe and effective manner. It will also help reduce the risk of nausea and vomiting and allow earlier discharge from the hospital.   The block is performed under sedation with ultrasound guidance prior to your procedure.  Due to the sedation, your may or may not remember the block experience. The nerve block will begin to take effect anywhere from 5 to 30 minutes after being administered. You will be transported to the operating room from your surgery after the block is completed.   At the end of surgery, when the anesthesia wears off, you will notice a few things. Your may not be able to move or feel the part of your body targeted by the nerve block. These are normal experiences, and they will disappear as the block wears off.  If you had an interscalene nerve block performed (which is common for shoulder surgery), your voice can be very hoarse and you may feel that you are not able to take as deep a breath as you did before surgery. Some patients may also notice a droopy eyelid on the affected side. These symptoms will resolve once the block wears off.  Pain control: The nerve block technique used is a single injection that can last anywhere from 1-3 days. The duration of the numbness can vary between individuals. After leaving the hospital, it is important that you begin to take your prescribed pain medication when you start to sense the nerve block wearing off. This will help you avoid unpleasant pain at the time the nerve block wears off, which can sometimes be in the middle of the night. The block will only cover pain in the areas targeted by the nerve block so if you experience surgical pain outside of that area, please take your prescribed pain medication. Management of the "numb area": After a nerve block, you cannot feel pain, pressure, or temperature   in the affected area so there is an increased risk for injury. You should take extra care to protect the affected areas until sensation and movement returns. Please take caution to not come in contact with extremely hot or cold items because you will not be able to sense or protect yourself form the extremes of temperature.  You may experience some persistent numbness  after the procedure by most neurological deficits resolve over time and the incidence of serious long term neurological complications attributable to peripheral nerve blocks are relatively uncommon.   

## 2021-03-29 NOTE — Anesthesia Procedure Notes (Signed)
Procedure Name: LMA Insertion Date/Time: 03/29/2021 9:55 AM Performed by: Michaele Offer, CRNA Pre-anesthesia Checklist: Patient identified, Patient being monitored, Timeout performed, Emergency Drugs available and Suction available Patient Re-evaluated:Patient Re-evaluated prior to induction Oxygen Delivery Method: Circle system utilized Preoxygenation: Pre-oxygenation with 100% oxygen Induction Type: IV induction Ventilation: Mask ventilation without difficulty LMA: LMA inserted LMA Size: 4.0 Tube type: Oral Number of attempts: 1 Placement Confirmation: positive ETCO2 and breath sounds checked- equal and bilateral Tube secured with: Tape Dental Injury: Teeth and Oropharynx as per pre-operative assessment

## 2021-03-29 NOTE — Anesthesia Preprocedure Evaluation (Signed)
Anesthesia Evaluation  Patient identified by MRN, date of birth, ID band Patient awake    Reviewed: Allergy & Precautions, H&P , NPO status , Patient's Chart, lab work & pertinent test results  Airway Mallampati: II  TM Distance: >3 FB Neck ROM: full    Dental no notable dental hx.    Pulmonary former smoker,    Pulmonary exam normal breath sounds clear to auscultation       Cardiovascular Normal cardiovascular exam Rhythm:regular Rate:Normal     Neuro/Psych PSYCHIATRIC DISORDERS    GI/Hepatic   Endo/Other  diabetesMorbid obesity  Renal/GU      Musculoskeletal   Abdominal   Peds  Hematology   Anesthesia Other Findings   Reproductive/Obstetrics                             Anesthesia Physical Anesthesia Plan  ASA: 2  Anesthesia Plan: General LMA   Post-op Pain Management:  Regional for Post-op pain   Induction:   PONV Risk Score and Plan: 2 and Treatment may vary due to age or medical condition, Ondansetron and Dexamethasone  Airway Management Planned:   Additional Equipment:   Intra-op Plan:   Post-operative Plan:   Informed Consent: I have reviewed the patients History and Physical, chart, labs and discussed the procedure including the risks, benefits and alternatives for the proposed anesthesia with the patient or authorized representative who has indicated his/her understanding and acceptance.     Dental Advisory Given  Plan Discussed with: CRNA  Anesthesia Plan Comments:         Anesthesia Quick Evaluation

## 2021-03-29 NOTE — H&P (Signed)
PREOPERATIVE H&P  Chief Complaint: Right Rotator Cuff Tear  HPI: Scott Quinn is a 60 y.o. male who presents for preoperative history and physical with a diagnosis of Right Rotator Cuff Tear confirmed by MRI. The patient states he slipped off of his bed and hit his nightstand causing the injury to his right shoulder. This occurred on 01/29/2021. Since that time, he has had difficulty with pain and limited range of motion of the right shoulder.Symptoms of weakness, pain and limited range of motion of the right shoulder are significantly impairing activities of daily living.  Patient has undergone successful left shoulder rotator cuff repair by me in the past.  He wished to proceed with surgical fixation of the right rotator cuff.  Past Medical History:  Diagnosis Date   Costochondritis    Diabetes mellitus without complication ([place])    NO MEDS   DVT (deep venous thrombosis) ([place])    LAST DVT IN 2018   Wears contact lenses    Past Surgical History:  Procedure Laterality Date   BACK SURGERY     LOWER   CARDIAC CATHETERIZATION N/A 07/19/2016   Procedure: Intravascular Ultrasound/IVUS;  Surgeon: Elam Dutch, MD;  Location: [facility];  Service: Cardiovascular;  Laterality: N/A;   COLONOSCOPY     FOOT SURGERY     X2   INNER EAR SURGERY     KNEE SURGERY     NASAL SEPTUM SURGERY     PERIPHERAL VASCULAR CATHETERIZATION Left 07/19/2016   Procedure: Lower Extremity Venography;  Surgeon: Elam Dutch, MD;  Location: [facility];  Service: Cardiovascular;  Laterality: Left;   PERIPHERAL VASCULAR CATHETERIZATION  07/19/2016   Procedure: Thrombectomy;  Surgeon: Elam Dutch, MD;  Location: [facility];  Service: Cardiovascular;;  left venous system    SHOULDER ARTHROSCOPY WITH OPEN ROTATOR CUFF REPAIR Left 02/10/2018   Procedure: SHOULDER ARTHROSCOPY WITH MINI OPEN ROTATOR CUFF REPAIR,DISTAL CLAVICAL EXCISION, SUBACROMINAL DECOMPRESSION, AND DEBRIDEMENT;  Surgeon:  Thornton Park, MD;  Location: [facility];  Service: Orthopedics;  Laterality: Left;   TONSILECTOMY, ADENOIDECTOMY, BILATERAL MYRINGOTOMY AND TUBES     Social History   Socioeconomic History   Marital status: Single    Spouse name: Not on file   Number of children: Not on file   Years of education: Not on file   Highest education level: Not on file  Occupational History   Not on file  Tobacco Use   Smoking status: Former    Packs/day: 1.00    Years: 15.00    Pack years: 15.00    Types: Cigarettes    Quit date: 11/09/2018    Years since quitting: 2.3   Smokeless tobacco: Never  Vaping Use   Vaping Use: Never used  Substance and Sexual Activity   Alcohol use: Yes    Alcohol/week: 56.0 standard drinks    Types: 56 Cans of beer per week    Comment: 6-8 DAILY   Drug use: No   Sexual activity: Yes  Other Topics Concern   Not on file  Social History Narrative   Not on file   Social Determinants of Health   Financial Resource Strain: Not on file  Food Insecurity: Not on file  Transportation Needs: Not on file  Physical Activity: Not on file  Stress: Not on file  Social Connections: Not on file   Family History  Problem Relation Age of Onset   COPD Mother    Emphysema Mother  Allergies  Allergen Reactions   Cheese Anaphylaxis   Latex Shortness Of Breath and Swelling    HAD REACTION TO BAND IN UNDERWEAR AND TO EKG ELECTRODES   Other Anaphylaxis    Tree nuts, and milk products   Peanut-Containing Drug Products Anaphylaxis   Wheat Bran Hives   Thimerosal Other (See Comments)    Redness    Prior to Admission medications   Medication Sig Start Date End Date Taking? Authorizing Provider  EPINEPHrine 0.3 mg/0.3 mL IJ SOAJ injection Inject 0.3 mLs (0.3 mg total) into the muscle as needed for anaphylaxis. 04/16/19   Brown, Hayes N, MD  XARELTO 20 MG TABS tablet TAKE 1 TABLET BY MOUTH EVERY DAY Patient not taking: Reported on 03/29/2021 09/28/19   Cain, Brandon  Christopher, MD     Positive ROS: All other systems have been reviewed and were otherwise negative with the exception of those mentioned in the HPI and as above.  Physical Exam: General: Alert, no acute distress Cardiovascular: Regular rate and rhythm, no murmurs rubs or gallops.  No pedal edema Respiratory: Clear to auscultation bilaterally, no wheezes rales or rhonchi. No cyanosis, no use of accessory musculature GI: No organomegaly, abdomen is soft and non-tender nondistended with positive bowel sounds. Skin: Skin intact, no lesions within the operative field. Neurologic: Sensation intact distally Psychiatric: Patient is competent for consent with normal mood and affect Lymphatic: No cervical lymphadenopathy  MUSCULOSKELETAL: Right shoulder: The patient can forward elevate and abduct to approximately 90 degrees with discomfort. He had weakness and pain with resisted shoulder abduction and demonstrated weakness of shoulder external rotation as well. He did not have pain with resisted internal rotation of his right shoulder. He had positive impingement signs, but no apprehension or instability.  Assessment: Right Rotator Cuff Tear  Plan: Plan for Procedure(s): RIGHT SHOULDER ARTHROSCOPY WITH MINI-OPEN ROTATOR CUFF REPAIR AND DISTAL CLAVICLE ACROMINECTOMY SHOULDER ARTHROSCOPY WITH SUBACROMIAL DECOMPRESSION AND DISTAL CLAVICLE EXCISION  I met with the patient in the preoperative area.  I reviewed the details of the operation as well as the postoperative course.  A preop history and physical was performed at the bedside this morning.  I marked the right shoulder according hospital's correct site of surgery protocol.  Reviewed the patient's MRI in preparation for this case.  I discussed the risks and benefits of surgery. The risks include but are not limited to infection, bleeding , nerve or blood vessel injury, joint stiffness or loss of motion, persistent pain, weakness or instability,  retear of the rotator cuff or failure of the repair hardware failure and the need for further surgery. Medical risks include but are not limited to DVT and pulmonary embolism, myocardial infarction, stroke, pneumonia, respiratory failure and death. Patient and his wife who was with him this morning understood these risks and wished to proceed.     Kevin Krasinski, MD   03/29/2021 9:39 AM   

## 2021-03-29 NOTE — Anesthesia Procedure Notes (Signed)
Anesthesia Regional Block: Interscalene brachial plexus block   Pre-Anesthetic Checklist: , timeout performed,  Correct Patient, Correct Site, Correct Laterality,  Correct Procedure, Correct Position, site marked,  Risks and benefits discussed,  Surgical consent,  Pre-op evaluation,  At surgeon's request and post-op pain management  Laterality: Right  Prep: chloraprep       Needles:  Injection technique: Single-shot  Needle Type: Stimiplex     Needle Length: 10cm  Needle Gauge: 21     Additional Needles:   Procedures:,,,, ultrasound used (permanent image in chart),,    Narrative:  Start time: 03/29/2021 8:48 AM End time: 03/29/2021 8:56 AM Injection made incrementally with aspirations every 5 mL.  Performed by: Personally  Anesthesiologist: Ranee Gosselin, MD  Additional Notes: Functioning IV was confirmed and monitors applied. Ultrasound guidance: relevant anatomy identified, needle position confirmed, local anesthetic spread visualized around nerve(s)., vascular puncture avoided.  Image printed for medical record.  Negative aspiration and no paresthesias; incremental administration of local anesthetic. The patient tolerated the procedure well. Vitals signes recorded in RN notes.

## 2021-03-30 ENCOUNTER — Encounter: Payer: Self-pay | Admitting: Orthopedic Surgery

## 2023-12-03 ENCOUNTER — Ambulatory Visit: Payer: Self-pay

## 2023-12-03 ENCOUNTER — Other Ambulatory Visit: Payer: Self-pay

## 2023-12-03 ENCOUNTER — Emergency Department

## 2023-12-03 ENCOUNTER — Emergency Department
Admission: EM | Admit: 2023-12-03 | Discharge: 2023-12-03 | Disposition: A | Discharge status: Home / Self Care | Attending: Emergency Medicine | Admitting: Emergency Medicine

## 2023-12-03 DIAGNOSIS — J069 Acute upper respiratory infection, unspecified: Secondary | ICD-10-CM | POA: Insufficient documentation

## 2023-12-03 DIAGNOSIS — E119 Type 2 diabetes mellitus without complications: Secondary | ICD-10-CM | POA: Insufficient documentation

## 2023-12-03 DIAGNOSIS — R051 Acute cough: Secondary | ICD-10-CM

## 2023-12-03 DIAGNOSIS — F17200 Nicotine dependence, unspecified, uncomplicated: Secondary | ICD-10-CM | POA: Diagnosis not present

## 2023-12-03 DIAGNOSIS — Z7901 Long term (current) use of anticoagulants: Secondary | ICD-10-CM | POA: Insufficient documentation

## 2023-12-03 DIAGNOSIS — R059 Cough, unspecified: Secondary | ICD-10-CM | POA: Diagnosis present

## 2023-12-03 DIAGNOSIS — Z86718 Personal history of other venous thrombosis and embolism: Secondary | ICD-10-CM | POA: Diagnosis not present

## 2023-12-03 LAB — RESP PANEL BY RT-PCR (RSV, FLU A&B, COVID)  RVPGX2
Influenza A by PCR: NEGATIVE
Influenza B by PCR: NEGATIVE
Resp Syncytial Virus by PCR: NEGATIVE
SARS Coronavirus 2 by RT PCR: NEGATIVE

## 2023-12-03 MED ORDER — AZITHROMYCIN 250 MG PO TABS: ORAL_TABLET | ORAL | 0 refills | Status: AC

## 2023-12-03 MED ORDER — BENZONATATE 100 MG PO CAPS: ORAL_CAPSULE | ORAL | 0 refills | Status: AC

## 2023-12-03 MED ORDER — ALBUTEROL SULFATE HFA 108 (90 BASE) MCG/ACT IN AERS: 2.0000 | INHALATION_SPRAY | Freq: Four times a day (QID) | RESPIRATORY_TRACT | 0 refills | Status: AC | PRN

## 2023-12-03 NOTE — Discharge Instructions (Addendum)
 Your exam, viral panel test, and chest x-ray are normal and reassuring at this time and no evidence of pneumonia.  Symptoms may represent a viral etiology including viral bronchitis.  Take prescription meds as directed.  Consider OTC Delsym for additional cough relief.  Rest, hydrate, and follow-up with your primary provider for ongoing evaluation.  Return to the ED if needed.

## 2023-12-03 NOTE — ED Provider Notes (Signed)
 [facility] Emergency Department Provider Note     Event Date/Time   First MD Initiated Contact with Patient 12/03/23 1811     (approximate)   History   Nasal Congestion   HPI  Scott Quinn is a 63 y.o. 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  Patient with better than a week of symptoms.  He has been taken OTC expectorant with limited benefit.  Denies any chest pain, shortness of breath, cough, or fevers.  No headache or vision change reported.  Physical Exam   Triage Vital Signs: ED Triage Vitals  Encounter Vitals Group     BP 12/03/23 1623 (!) 161/92     Systolic BP Percentile --      Diastolic BP Percentile --      Pulse Rate 12/03/23 1623 96     Resp 12/03/23 1623 16     Temp 12/03/23 1623 98.8 F (37.1 C)     Temp Source 12/03/23 1623 Oral     SpO2 12/03/23 1623 96 %     Weight 12/03/23 1626 230 lb (104.3 kg)     Height 12/03/23 1626 6' (1.829 m)     Head Circumference --      Peak Flow --      Pain Score 12/03/23 1624 0     Pain Loc --      Pain Education --      Exclude from Growth Chart --     Most recent vital signs: Vitals:   12/03/23 1623 12/03/23 1940  BP: (!) 161/92 (!) 145/92  Pulse: 96 89  Resp: 16 18  Temp: 98.8 F (37.1 C)   SpO2: 96% 99%    General Awake, no distress. NAD HEENT NCAT. PERRL. EOMI. No rhinorrhea. Mucous membranes are moist.  CV:  Good peripheral perfusion. RRR RESP:  Normal effort.  Mild expiratory rhonchi noted bilaterally ABD:  No distention.    ED Results / Procedures / Treatments   Labs (all labs ordered are listed, but only abnormal results are displayed) Labs Reviewed  RESP PANEL BY RT-PCR (RSV, FLU A&B, COVID)  [id number]     EKG   RADIOLOGY  I personally viewed and evaluated these images as part of my medical decision making, as well as reviewing the  written report by the radiologist.  ED Provider Interpretation: No acute findings  DG Chest 2 View Result Date: 12/03/2023 CLINICAL DATA:  Productive cough for 2 weeks, initial encounter EXAM: CHEST - 2 VIEW COMPARISON:  03/10/2017 FINDINGS: The heart size and mediastinal contours are within normal limits. Both lungs are clear. The visualized skeletal structures are unremarkable. IMPRESSION: No active cardiopulmonary disease. Electronically Signed   By: Violeta Grey M.D.   On: 12/03/2023 19:20     PROCEDURES:  Critical Care performed: No  Procedures   MEDICATIONS ORDERED IN ED: Medications - No data to display   IMPRESSION / MDM / ASSESSMENT AND PLAN / ED COURSE  I reviewed the triage vital signs and the nursing notes.                              Differential diagnosis includes, but is not limited to, COVID, flu, RSV, viral URI, rhinitis, sinusitis  Patient's presentation is most consistent with acute, uncomplicated illness.  Patient's diagnosis is consistent with viral URI  with likely cough.  With reassuring exam and workup at this time.  Some mild bilateral rhonchi noted on exam.  No acute respiratory distress appreciated.  Viral respiratory panel is negative and chest x-ray interpreted by me is negative for any acute findings.  Patient is stable for outpatient management and will be discharged home with prescriptions for azithromycin given his 10 days of productive cough, albuterol  for his bronchospasm and rhonchi, as well as Tessalon Perles. Patient is to follow up with his primary provider or local urgent care as discussed, as needed or otherwise directed. Patient is given ED precautions to return to the ED for any worsening or new symptoms.   FINAL CLINICAL IMPRESSION(S) / ED DIAGNOSES   Final diagnoses:  Acute cough     Rx / DC Orders   ED Discharge Orders          Ordered    albuterol  (VENTOLIN  HFA) 108 (90 Base) MCG/ACT inhaler  Every 6 hours PRN        12/03/23  1932    azithromycin (ZITHROMAX Z-PAK) 250 MG tablet        12/03/23 1932    benzonatate (TESSALON PERLES) 100 MG capsule        12/03/23 1932             Note:  This document was prepared using Dragon voice recognition software and may include unintentional dictation errors.    May Sparks, PA-C 12/03/23 2328    Kandee Orion, MD 12/06/23 [phone]

## 2023-12-03 NOTE — Telephone Encounter (Signed)
 Copied from CRM [id number]. Topic: Clinical - Red Word Triage >> Dec 03, 2023 12:49 PM Margarette Shawl wrote: Red Word that prompted transfer to Nurse Triage:   Symptoms for 1 week Severly congested Cough, green phlegm Mild intermittent shortness of breath No fever, chest pain   Chief Complaint: Congestion, productive cough with white phlegm Symptoms: Congestion, Productive phlegm with white phlegm Frequency: 8 days Pertinent Negatives: Patient denies fever, chest pain, nausea, vomiting, diarrhea, difficulty breathing, sore throat, ear aches Disposition: [] ED /[x] Urgent Care (no appt availability in office) / [] Appointment(In office/virtual)/ []  [facility]/ [] Home Care/ [] Refused Recommended Disposition /[] [facility]/ []  Follow-up with PCP Additional Notes: Patient called and advised that for the past 8 days he has been having a productive cough with white phlegm that is very persistent.  He also states that he has a lot of congestion.  He doesn't have an established PCP nor has he ever been a patient at the Pulmonary clinics. Patient is advised that with his symptoms the recommendation is for him to be seen in the next 24 hours by a provider. There was no availability for any appointments soon for New Pulm Patient per Pulmonary Staff so patient is advised of this and advised that Urgent Care would be a good alternative. Patient states that he will probably end up going to the Emergency Room to be seen. Patient is also advised that if anything worsens to call 911 for an ambulance if needed. Patient verbalized understanding.  Reason for Disposition  [1] Continuous (nonstop) coughing interferes with work or school AND [2] no improvement using cough treatment per Care Advice  Answer Assessment - Initial Assessment Questions 1. ONSET: "When did the cough begin?"      8 days ago 2. SEVERITY: "How bad is the cough today?"      persistent 3. SPUTUM: "Describe the color of your  sputum" (none, dry cough; clear, white, yellow, green)     white 4. HEMOPTYSIS: "Are you coughing up any blood?" If so ask: "How much?" (flecks, streaks, tablespoons, etc.)     No 5. DIFFICULTY BREATHING: "Are you having difficulty breathing?" If Yes, ask: "How bad is it?" (e.g., mild, moderate, severe)    - MILD: No SOB at rest, mild SOB with walking, speaks normally in sentences, can lie down, no retractions, pulse < 100.    - MODERATE: SOB at rest, SOB with minimal exertion and prefers to sit, cannot lie down flat, speaks in phrases, mild retractions, audible wheezing, pulse 100-120.    - SEVERE: Very SOB at rest, speaks in single words, struggling to breathe, sitting hunched forward, retractions, pulse > 120      Patient states only after a long coughing fit but no difficulty breathing 6. FEVER: "Do you have a fever?" If Yes, ask: "What is your temperature, how was it measured, and when did it start?"     No 7. CARDIAC HISTORY: "Do you have any history of heart disease?" (e.g., heart attack, congestive heart failure)      no 8. LUNG HISTORY: "Do you have any history of lung disease?"  (e.g., pulmonary embolus, asthma, emphysema)     Former smoker over 6 years ago 9. 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 OTHER SYMPTOMS: "Do you have any other symptoms?" (  e.g., runny nose, wheezing, chest pain)       Productive cough with white phlegm, runny nose,  12. TRAVEL: "Have you traveled out of the country in the last month?" (e.g., travel history, exposures)       No  Protocols used: Cough - Acute Productive-A-AH

## 2023-12-03 NOTE — ED Triage Notes (Signed)
 Pt arrives via POV with c/o nasal congestion/9 days. Pt has been taking OTC expectorant and it hasn't helped. Pt denies CP, SOB, fevers. Pt is A&Ox4.

## 2024-04-12 ENCOUNTER — Telehealth: Payer: Self-pay

## 2024-04-12 NOTE — Telephone Encounter (Signed)
 Pt called with concerns of having a DVT in RLE. He states he has had progressively worsening RLE calf swelling, heaviness and tenderness. The area is not red/warm. He has a h/o DVT and is currently not on any blood thinners. He was offered an appt tomorrow morning and [place] the [place] and would like a Wednesday appt. I have advised him to be seen at ED or UC today or tomorrow. He seemed hesitant but said he would do so and f/u with us  if needed.
